# Patient Record
Sex: Female | Born: 1990 | Race: Black or African American | Hispanic: No | Marital: Single | Smoking: Never smoker
Health system: Southern US, Community
[De-identification: ages and names within clinical notes are randomized; demographics above are authoritative.]

## PROBLEM LIST (undated history)

## (undated) ENCOUNTER — Emergency Department: Discharge status: Absent without leave | Payer: Commercial Managed Care - PPO

## (undated) DIAGNOSIS — Z789 Other specified health status: Secondary | ICD-10-CM

## (undated) HISTORY — PX: WISDOM TOOTH EXTRACTION: SHX21

## (undated) HISTORY — PX: NO PAST SURGERIES: SHX2092

---
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
## 2016-09-26 ENCOUNTER — Other Ambulatory Visit
Admission: AD | Admit: 2016-09-26 | Discharge: 2016-09-26 | Disposition: A | Discharge status: Home / Self Care | Attending: Family Medicine | Admitting: Family Medicine

## 2016-09-26 NOTE — ED Notes (Signed)
Pt brought in to ED by [employer] officer Pride for forensic blood draw. Pt calm and cooperative in ED and consented to blood draw. Blood drawn by this RN with one attempt on left AC. Betadine was used in cleaning the site and clean gauze and tape put over area when draw was completed. Pt ambulated from ED with steady gait and NAD noted.

## 2020-01-07 ENCOUNTER — Other Ambulatory Visit: Payer: Self-pay

## 2020-01-07 ENCOUNTER — Other Ambulatory Visit (HOSPITAL_COMMUNITY)
Admission: RE | Admit: 2020-01-07 | Discharge: 2020-01-07 | Disposition: A | Discharge status: Home / Self Care | Source: Ambulatory Visit | Attending: Obstetrics and Gynecology | Admitting: Obstetrics and Gynecology

## 2020-01-07 ENCOUNTER — Encounter: Payer: Self-pay | Admitting: Obstetrics and Gynecology

## 2020-01-07 ENCOUNTER — Ambulatory Visit (INDEPENDENT_AMBULATORY_CARE_PROVIDER_SITE_OTHER): Payer: Commercial Managed Care - PPO | Admitting: Obstetrics and Gynecology

## 2020-01-07 VITALS — BP 118/78 | HR 92 | Ht 64.0 in | Wt 167.0 lb

## 2020-01-07 DIAGNOSIS — Z3149 Encounter for other procreative investigation and testing: Secondary | ICD-10-CM

## 2020-01-07 DIAGNOSIS — Z124 Encounter for screening for malignant neoplasm of cervix: Secondary | ICD-10-CM

## 2020-01-07 DIAGNOSIS — Z3401 Encounter for supervision of normal first pregnancy, first trimester: Secondary | ICD-10-CM

## 2020-01-07 DIAGNOSIS — Z113 Encounter for screening for infections with a predominantly sexual mode of transmission: Secondary | ICD-10-CM

## 2020-01-07 DIAGNOSIS — Z369 Encounter for antenatal screening, unspecified: Secondary | ICD-10-CM

## 2020-01-07 DIAGNOSIS — N926 Irregular menstruation, unspecified: Secondary | ICD-10-CM

## 2020-01-07 DIAGNOSIS — O219 Vomiting of pregnancy, unspecified: Secondary | ICD-10-CM

## 2020-01-07 DIAGNOSIS — Z7185 Encounter for immunization safety counseling: Secondary | ICD-10-CM

## 2020-01-07 DIAGNOSIS — Z130 Encounter for screening for diseases of the blood and blood-forming organs and certain disorders involving the immune mechanism: Secondary | ICD-10-CM

## 2020-01-07 DIAGNOSIS — Z3403 Encounter for supervision of normal first pregnancy, third trimester: Secondary | ICD-10-CM | POA: Insufficient documentation

## 2020-01-07 MED ORDER — DOXYLAMINE-PYRIDOXINE 10-10 MG PO TBEC: 2.0000 | DELAYED_RELEASE_TABLET | Freq: Every day | ORAL | 5 refills | Status: DC

## 2020-01-07 NOTE — Patient Instructions (Signed)
First Trimester of Pregnancy The first trimester of pregnancy is from week 1 until the end of week 13 (months 1 through 3). A week after a sperm fertilizes an egg, the egg will implant on the wall of the uterus. This embryo will begin to develop into a baby. Genes from you and your partner will form the baby. The female genes will determine whether the baby will be a boy or a girl. At 6-8 weeks, the eyes and face will be formed, and the heartbeat can be seen on ultrasound. At the end of 12 weeks, all the baby's organs will be formed. Now that you are pregnant, you will want to do everything you can to have a healthy baby. Two of the most important things are to get good prenatal care and to follow your health care provider's instructions. Prenatal care is all the medical care you receive before the baby's birth. This care will help prevent, find, and treat any problems during the pregnancy and childbirth. Body changes during your first trimester Your body goes through many changes during pregnancy. The changes vary from woman to woman.  You may gain or lose a couple of pounds at first.  You may feel sick to your stomach (nauseous) and you may throw up (vomit). If the vomiting is uncontrollable, call your health care provider.  You may tire easily.  You may develop headaches that can be relieved by medicines. All medicines should be approved by your health care provider.  You may urinate more often. Painful urination may mean you have a bladder infection.  You may develop heartburn as a result of your pregnancy.  You may develop constipation because certain hormones are causing the muscles that push stool through your intestines to slow down.  You may develop hemorrhoids or swollen veins (varicose veins).  Your breasts may begin to grow larger and become tender. Your nipples may stick out more, and the tissue that surrounds them (areola) may become darker.  Your gums may bleed and may be  sensitive to brushing and flossing.  Dark spots or blotches (chloasma, mask of pregnancy) may develop on your face. This will likely fade after the baby is born.  Your menstrual periods will stop.  You may have a loss of appetite.  You may develop cravings for certain kinds of food.  You may have changes in your emotions from day to day, such as being excited to be pregnant or being concerned that something may go wrong with the pregnancy and baby.  You may have more vivid and strange dreams.  You may have changes in your hair. These can include thickening of your hair, rapid growth, and changes in texture. Some women also have hair loss during or after pregnancy, or hair that feels dry or thin. Your hair will most likely return to normal after your baby is born. What to expect at prenatal visits During a routine prenatal visit:  You will be weighed to make sure you and the baby are growing normally.  Your blood pressure will be taken.  Your abdomen will be measured to track your baby's growth.  The fetal heartbeat will be listened to between weeks 10 and 14 of your pregnancy.  Test results from any previous visits will be discussed. Your health care provider may ask you:  How you are feeling.  If you are feeling the baby move.  If you have had any abnormal symptoms, such as leaking fluid, bleeding, severe headaches, or abdominal   cramping.  If you are using any tobacco products, including cigarettes, chewing tobacco, and electronic cigarettes.  If you have any questions. Other tests that may be performed during your first trimester include:  Blood tests to find your blood type and to check for the presence of any previous infections. The tests will also be used to check for low iron levels (anemia) and protein on red blood cells (Rh antibodies). Depending on your risk factors, or if you previously had diabetes during pregnancy, you may have tests to check for high blood sugar  that affects pregnant women (gestational diabetes).  Urine tests to check for infections, diabetes, or protein in the urine.  An ultrasound to confirm the proper growth and development of the baby.  Fetal screens for spinal cord problems (spina bifida) and Down syndrome.  HIV (human immunodeficiency virus) testing. Routine prenatal testing includes screening for HIV, unless you choose not to have this test.  You may need other tests to make sure you and the baby are doing well. Follow these instructions at home: Medicines  Follow your health care provider's instructions regarding medicine use. Specific medicines may be either safe or unsafe to take during pregnancy.  Take a prenatal vitamin that contains at least 600 micrograms (mcg) of folic acid.  If you develop constipation, try taking a stool softener if your health care provider approves. Eating and drinking   Eat a balanced diet that includes fresh fruits and vegetables, whole grains, good sources of protein such as meat, eggs, or tofu, and low-fat dairy. Your health care provider will help you determine the amount of weight gain that is right for you.  Avoid raw meat and uncooked cheese. These carry germs that can cause birth defects in the baby.  Eating four or five small meals rather than three large meals a day may help relieve nausea and vomiting. If you start to feel nauseous, eating a few soda crackers can be helpful. Drinking liquids between meals, instead of during meals, also seems to help ease nausea and vomiting.  Limit foods that are high in fat and processed sugars, such as fried and sweet foods.  To prevent constipation: ? Eat foods that are high in fiber, such as fresh fruits and vegetables, whole grains, and beans. ? Drink enough fluid to keep your urine clear or pale yellow. Activity  Exercise only as directed by your health care provider. Most women can continue their usual exercise routine during  pregnancy. Try to exercise for 30 minutes at least 5 days a week. Exercising will help you: ? Control your weight. ? Stay in shape. ? Be prepared for labor and delivery.  Experiencing pain or cramping in the lower abdomen or lower back is a good sign that you should stop exercising. Check with your health care provider before continuing with normal exercises.  Try to avoid standing for long periods of time. Move your legs often if you must stand in one place for a long time.  Avoid heavy lifting.  Wear low-heeled shoes and practice good posture.  You may continue to have sex unless your health care provider tells you not to. Relieving pain and discomfort  Wear a good support bra to relieve breast tenderness.  Take warm sitz baths to soothe any pain or discomfort caused by hemorrhoids. Use hemorrhoid cream if your health care provider approves.  Rest with your legs elevated if you have leg cramps or low back pain.  If you develop varicose veins in   your legs, wear support hose. Elevate your feet for 15 minutes, 3-4 times a day. Limit salt in your diet. Prenatal care  Schedule your prenatal visits by the twelfth week of pregnancy. They are usually scheduled monthly at first, then more often in the last 2 months before delivery.  Write down your questions. Take them to your prenatal visits.  Keep all your prenatal visits as told by your health care provider. This is important. Safety  Wear your seat belt at all times when driving.  Make a list of emergency phone numbers, including numbers for family, friends, the hospital, and police and fire departments. General instructions  Ask your health care provider for a referral to a local prenatal education class. Begin classes no later than the beginning of month 6 of your pregnancy.  Ask for help if you have counseling or nutritional needs during pregnancy. Your health care provider can offer advice or refer you to specialists for help  with various needs.  Do not use hot tubs, steam rooms, or saunas.  Do not douche or use tampons or scented sanitary pads.  Do not cross your legs for long periods of time.  Avoid cat litter boxes and soil used by cats. These carry germs that can cause birth defects in the baby and possibly loss of the fetus by miscarriage or stillbirth.  Avoid all smoking, herbs, alcohol, and medicines not prescribed by your health care provider. Chemicals in these products affect the formation and growth of the baby.  Do not use any products that contain nicotine or tobacco, such as cigarettes and e-cigarettes. If you need help quitting, ask your health care provider. You may receive counseling support and other resources to help you quit.  Schedule a dentist appointment. At home, brush your teeth with a soft toothbrush and be gentle when you floss. Contact a health care provider if:  You have dizziness.  You have mild pelvic cramps, pelvic pressure, or nagging pain in the abdominal area.  You have persistent nausea, vomiting, or diarrhea.  You have a bad smelling vaginal discharge.  You have pain when you urinate.  You notice increased swelling in your face, hands, legs, or ankles.  You are exposed to fifth disease or chickenpox.  You are exposed to German measles (rubella) and have never had it. Get help right away if:  You have a fever.  You are leaking fluid from your vagina.  You have spotting or bleeding from your vagina.  You have severe abdominal cramping or pain.  You have rapid weight gain or loss.  You vomit blood or material that looks like coffee grounds.  You develop a severe headache.  You have shortness of breath.  You have any kind of trauma, such as from a fall or a car accident. Summary  The first trimester of pregnancy is from week 1 until the end of week 13 (months 1 through 3).  Your body goes through many changes during pregnancy. The changes vary from  woman to woman.  You will have routine prenatal visits. During those visits, your health care provider will examine you, discuss any test results you may have, and talk with you about how you are feeling. This information is not intended to replace advice given to you by your health care provider. Make sure you discuss any questions you have with your health care provider. Document Revised: 12/10/2016 Document Reviewed: 12/10/2015 Elsevier Patient Education  2020 Elsevier Inc[employer]  

## 2020-01-07 NOTE — Progress Notes (Signed)
New Obstetric Patient H&P    Chief Complaint: Missed period, + home pregnancy test   History of Present Illness: Patient is a 29 y.o. G1P0 Not Hispanic or Latino female, presents with amenorrhea and positive home pregnancy test. Patient's last menstrual period was 10/10/2019 (exact date). and based on her  LMP, her EDD is Estimated Date of Delivery: 07/16/20 and her EGA is [redacted]w[redacted]d. Cycles are 7. days, regular, and occur approximately every : 28 days. Patient unsure of last pap - denies hx of abn pap smears.  She had a urine pregnancy test which was positive 3 or 4 week(s)  ago. Her last menstrual period was normal and lasted for  6 or 7 day(s). Since her LMP she claims she has experienced food aversion, decreased appetite. She denies vaginal bleeding. Her past medical history is noncontributory. This is her first pregnancy.  Since her LMP, she admits to the use of tobacco products  no She claims has  lost 7  pounds since the start of her pregnancy. Patient states this is more related to food aversion and nausea than frequent episodes of vomiting. There are cats in the home in the home  no She admits close contact with children on a regular basis  no  She has had chicken pox in the past no She has had Tuberculosis exposures, symptoms, or previously tested positive for TB   no Current or past history of domestic violence. no  Genetic Screening/Teratology Counseling: (Includes patient, baby's father, or anyone in either family with:)   1. Patient's age >/= 58 at [facility]  no 2. Thalassemia ([place], [place], Mediterranean, or Asian background): MCV<80  no 3. Neural tube defect (meningomyelocele, spina bifida, anencephaly)  no 4. Congenital heart defect  no  5. Down syndrome  no 6. Tay-Sachs (Jewish, [place]  no 7. Canavan's Disease  no 8. Sickle cell disease or trait (African)  no  9. Hemophilia or other blood disorders  no  10. Muscular dystrophy  no  11. Cystic fibrosis  no  12.  Huntington's Chorea  no  13. Mental retardation/autism  no 14. Other inherited genetic or chromosomal disorder  no 15. Maternal metabolic disorder (DM, PKU, etc)  no 16. Patient or FOB with a child with a birth defect not listed above no  16a. Patient or FOB with a birth defect themselves no 17. Recurrent pregnancy loss, or stillbirth  no  18. Any medications since LMP other than prenatal vitamins (include vitamins, supplements, OTC meds, drugs, alcohol)  no 19. Any other genetic/environmental exposure to discuss  no  Infection History:   1. Lives with someone with TB or TB exposed  no  2. Patient or partner has history of genital herpes  no 3. Rash or viral illness since LMP  no 4. History of STI (GC, CT, HPV, syphilis, HIV)  no 5. 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     Review of Systems:10 point review of systems negative unless otherwise noted in HPI  Past Medical History:  Patient Active Problem List   Diagnosis Date Noted  . Encounter for supervision of normal first pregnancy in first trimester 01/07/2020    Clinic [facility] Prenatal Labs  Dating  LMP Blood type:     Genetic Screen Inheritest:  NIPS: Antibody:   Anatomic [place]  Rubella:   Varicella: @VZVIGG @  GTT Early: n/a        Third  trimester:  RPR:     Rhogam  HBsAg:     TDaP vaccine                       Flu Shot: declines HIV:     Baby Food    Undecided - breastfeeding ed at [facility]                            GBS:   Contraception  Pap: collected 01/07/20  CBB     CS/VBAC    Support Person           Past Surgical History:  History reviewed. No pertinent surgical history.  Gynecologic History: Patient's last menstrual period was 10/10/2019 (exact date).  Obstetric History: G1P0  Family History:  Family History  Problem Relation Age of Onset  . Cancer Maternal Aunt     Social History:  Social History   Socioeconomic  History  . Marital status: Single    Spouse name: Not on file  . Number of children: Not on file  . Years of education: Not on file  . Highest education level: Not on file  Occupational History  . Not on file  Tobacco Use  . Smoking status: Never Smoker  . Smokeless tobacco: Never Used  Vaping Use  . Vaping Use: Never used  Substance and Sexual Activity  . Alcohol use: Never  . Drug use: Never  . Sexual activity: Yes    Birth control/protection: None  Other Topics Concern  . Not on file  Social History Narrative  . Not on file   Social Determinants of Health   Financial Resource Strain: Not on file  Food Insecurity: Not on file  Transportation Needs: Not on file  Physical Activity: Not on file  Stress: Not on file  Social Connections: Not on file  Intimate Partner Violence: Not on file    Allergies:  No Known Allergies  Medications: Prior to Admission medications   Medication Sig Start Date End Date Taking? Authorizing Provider  Prenatal Vit-Fe Fumarate-FA (MULTIVITAMIN-PRENATAL) 27-0.8 MG TABS tablet Take 1 tablet by mouth daily at 12 noon.   Yes [provider]    Physical Exam Vitals: Blood pressure 118/78, pulse 92, height 5\' 4"  (1.626 m), weight 167 lb (75.8 kg), last menstrual period 10/10/2019.  General: NAD HEENT: normocephalic, anicteric Thyroid: no enlargement, no palpable nodules Pulmonary: No increased work of breathing, CTAB Cardiovascular: RRR, distal pulses 2+ Abdomen: NABS, soft, non-tender, non-distended.  Umbilicus without lesions.  No hepatomegaly, splenomegaly or masses palpable. No evidence of hernia  Genitourinary:  External: Normal external female genitalia.  Normal urethral meatus, normal  Bartholin's and Skene's glands.    Vagina: Normal vaginal mucosa, no evidence of prolapse.    Cervix: Grossly normal in appearance, no bleeding  Uterus: Enlarged (size consistent with 10-12wk dates), mobile, normal contour.  No CMT  Adnexa:  ovaries non-enlarged, no adnexal masses  Rectal: deferred Extremities: no edema, erythema, or tenderness Neurologic: Grossly intact Psychiatric: mood appropriate, affect full   Assessment: 29 y.o. G1P0 at [redacted]w[redacted]d presenting to initiate prenatal care  Plan: 1) Avoid alcoholic beverages. 2) Patient encouraged not to smoke.  3) Discontinue the use of all non-medicinal drugs and chemicals.  4) Take prenatal vitamins daily.  5) Nutrition, food safety (fish, cheese advisories, and high nitrite foods) and exercise discussed. 6) Hospital and practice style discussed with cross coverage system.  7) Genetic Screening,  such as with 1st Trimester Screening, cell free fetal DNA, AFP testing, and Ultrasound, as well as with amniocentesis and CVS as appropriate, is discussed with patient. At the conclusion of today's visit patient requested genetic testing - Inheritest today - NIPTs after dating [place] 8)NOB labs, pap/gc/ct, urine cx collected today 9) RTC in 2 wks for dating [place] and ROB   Zipporah Plants, CNM, MSN [facility], [facility] 01/07/2020, 4:33 PM

## 2020-01-09 LAB — URINE DRUG PANEL 7
Amphetamines, Urine: NEGATIVE ng/mL
Barbiturate Quant, Ur: NEGATIVE ng/mL
Benzodiazepine Quant, Ur: NEGATIVE ng/mL
Cannabinoid Quant, Ur: NEGATIVE ng/mL
Cocaine (Metab.): NEGATIVE ng/mL
Opiate Quant, Ur: NEGATIVE ng/mL
PCP Quant, Ur: NEGATIVE ng/mL

## 2020-01-09 LAB — CYTOLOGY - PAP
Chlamydia: NEGATIVE
Comment: NEGATIVE
Comment: NORMAL
Diagnosis: NEGATIVE
Neisseria Gonorrhea: NEGATIVE

## 2020-01-11 LAB — URINE CULTURE

## 2020-01-12 NOTE — L&D Delivery Note (Signed)
Delivery Note At 6:38 PM a viable female was delivered via Vaginal, Spontaneous (Presentation: Left Occiput Anterior).  APGAR: 9, 9; weight pending .   Placenta status: Spontaneous, Intact.  Cord: 3 vessels with the following complications: None.  Cord pH: n/a  Anesthesia: Epidural Episiotomy: None Lacerations: 1st degree;Vaginal Suture Repair:  4.0 vicryl Est. Blood Loss (mL):  pending (~375 mL)  Mom to postpartum.  Baby to Couplet care / Skin to Skin.  Called to see patient.  Mom pushed to deliver a viable female infant.  The head followed by shoulders, which delivered without difficulty, and the rest of the body.  No nuchal cord noted.  Baby to mom's chest.  Cord clamped and cut after > 1 min delay.  No cord blood obtained.  Placenta delivered spontaneously, intact, with a 3-vessel cord.  A small first degree left vaginal laceration repaired with 4-0 Vicryl in standard fashion.  All counts correct.  Hemostasis obtained with IV pitocin and fundal massage. EBL 375 mL.     Thomasene Mohair, MD 07/04/2020, 7:04 PM

## 2020-01-15 ENCOUNTER — Other Ambulatory Visit: Payer: Self-pay | Admitting: Obstetrics and Gynecology

## 2020-01-15 DIAGNOSIS — O360930 Maternal care for other rhesus isoimmunization, third trimester, not applicable or unspecified: Secondary | ICD-10-CM | POA: Insufficient documentation

## 2020-01-15 DIAGNOSIS — O360911 Maternal care for other rhesus isoimmunization, first trimester, fetus 1: Secondary | ICD-10-CM

## 2020-01-15 LAB — RPR+RH+ABO+RUB AB+AB SCR+CB...
HIV Screen 4th Generation wRfx: NONREACTIVE
Hematocrit: 38.8 % (ref 34.0–46.6)
Hemoglobin: 13.3 g/dL (ref 11.1–15.9)
Hepatitis B Surface Ag: NEGATIVE
MCH: 30.3 pg (ref 26.6–33.0)
MCHC: 34.3 g/dL (ref 31.5–35.7)
MCV: 88 fL (ref 79–97)
Platelets: 227 10*3/uL (ref 150–450)
RBC: 4.39 x10E6/uL (ref 3.77–5.28)
RDW: 12.2 % (ref 11.7–15.4)
RPR Ser Ql: NONREACTIVE
Rh Factor: POSITIVE
Rubella Antibodies, IGG: 2.26 index (ref >0.99)
Varicella zoster IgG: 260 index (ref >165)
WBC: 6.9 10*3/uL (ref 3.4–10.8)

## 2020-01-15 LAB — SICKLE CELL SCREEN: Sickle Cell Screen: NEGATIVE

## 2020-01-15 LAB — AB SCR+ANTIBODY ID
Antibody Screen: POSITIVE — AB
Coombs Titer #1: 2

## 2020-01-18 LAB — INHERITEST CORE(CF97,SMA,FRAX)

## 2020-01-22 ENCOUNTER — Ambulatory Visit (INDEPENDENT_AMBULATORY_CARE_PROVIDER_SITE_OTHER): Payer: Commercial Managed Care - PPO

## 2020-01-22 ENCOUNTER — Ambulatory Visit (INDEPENDENT_AMBULATORY_CARE_PROVIDER_SITE_OTHER): Payer: Commercial Managed Care - PPO | Admitting: Obstetrics and Gynecology

## 2020-01-22 ENCOUNTER — Encounter: Payer: Self-pay | Admitting: Obstetrics and Gynecology

## 2020-01-22 ENCOUNTER — Other Ambulatory Visit: Payer: Self-pay

## 2020-01-22 VITALS — BP 108/64 | Wt 167.0 lb

## 2020-01-22 DIAGNOSIS — O360911 Maternal care for other rhesus isoimmunization, first trimester, fetus 1: Secondary | ICD-10-CM

## 2020-01-22 DIAGNOSIS — Z3A15 15 weeks gestation of pregnancy: Secondary | ICD-10-CM

## 2020-01-22 DIAGNOSIS — N926 Irregular menstruation, unspecified: Secondary | ICD-10-CM | POA: Diagnosis not present

## 2020-01-22 DIAGNOSIS — Z23 Encounter for immunization: Secondary | ICD-10-CM

## 2020-01-22 DIAGNOSIS — Z1379 Encounter for other screening for genetic and chromosomal anomalies: Secondary | ICD-10-CM

## 2020-01-22 DIAGNOSIS — Z3402 Encounter for supervision of normal first pregnancy, second trimester: Secondary | ICD-10-CM

## 2020-01-22 LAB — POCT URINALYSIS DIPSTICK OB
Glucose, UA: NEGATIVE
POC,PROTEIN,UA: NEGATIVE

## 2020-01-22 MED ORDER — PROMETHAZINE HCL 25 MG PO TABS: 25.0000 mg | ORAL_TABLET | Freq: Four times a day (QID) | ORAL | 3 refills | Status: DC | PRN

## 2020-01-22 NOTE — Progress Notes (Signed)
ROB [redacted]w[redacted]d

## 2020-01-22 NOTE — Patient Instructions (Addendum)
Initial steps to help :   B6 (pyridoxine) 25 mg,  3-4 times a day- 200 mg a day total Unisom (doxylamine) 25 mg at bedtime **B6 and Unisom are available as a combination prescription medications called diclegis and bonjesta  B1 (thiamin)  50-100 mg 1-2 a day-  100 mg a day total  Continue prenatal vitamin with iron and thiamin. If it is not tolerated switch to 1 mg of folic acid.  Can add medication for gastric reflux if needed.  Subsequent steps to be added to B1, B6, and Unisom:  1. Antihistamine (one of the following medications) Dramamine      25-50 mg every 4-6 hours Benadryl      25-50 mg every 4-6 hours Meclizine      25 mg every 6 hours  2. Dopamine Antagonist (one of the following medications) Metoclopramide  (Reglan)  5-10 mg every 6-8 hours         PO Promethazine   (Phenergan)   12.5-25 mg every 4-6 hours      PO or rectal Prochlorperazine  (Compazine)  5-10 mg every 6-8 hours     25mg  BID rectally   Subsequent steps if there has still not been improvement in symptoms:  3. Daily stool softner:  Colace 100 mg twice a day  4. Ondansetron  (Zofran)   4-8 mg every 6-8 hours     Hyperemesis Gravidarum Hyperemesis gravidarum is a severe form of nausea and vomiting that happens during pregnancy. Hyperemesis is worse than morning sickness. It may cause you to have nausea or vomiting all day for many days. It may keep you from eating and drinking enough food and liquids, which can lead to dehydration, malnutrition, and weight loss. Hyperemesis usually occurs during the first half (the first 20 weeks) of pregnancy. It often goes away once a woman is in her second half of pregnancy. However, sometimes hyperemesis continues through an entire pregnancy. What are the causes? The cause of this condition is not known. It may be associated with:  Changes in hormones in the body during pregnancy.  Changes in the gastrointestinal system.  Genetic or inherited conditions. What are the  signs or symptoms? Symptoms of this condition include:  Severe nausea and vomiting that does not go away.  Problems keeping food down.  Weight loss.  Loss of body fluid (dehydration).  Loss of appetite. You may have no desire to eat or you may not like the food you have previously enjoyed. How is this diagnosed? This condition may be diagnosed based on your medical history, your symptoms, and a physical exam. You may also have other tests, including:  Blood tests.  Urine tests.  Blood pressure tests.  Ultrasound to look for problems with the placenta or to check if you are pregnant with more than one baby. How is this treated? This condition is managed by controlling symptoms. This may include:  Following an eating plan. This can help to lessen nausea and vomiting.  Treatments that do not use medicine. These include acupressure bracelets, hypnosis, and eating or drinking foods or fluids that contain ginger, ginger ale, or ginger tea.  Taking prescription medicine or over-the-counter medicine as told by your health care provider.  Continuing to take prenatal vitamins. You may need to change what kind you take and when you take them. Follow your health care provider's instructions about prenatal vitamins. An eating plan and medicines are often used together to help control symptoms. If medicines do not help  relieve nausea and vomiting, you may need to receive fluids through an IV at the hospital. Follow these instructions at home: To help relieve your symptoms, listen to your body. Everyone is different and has different preferences. Find what works best for you. Here are some things you can try to help relieve your symptoms: Meals and snacks  Eat 5-6 small meals daily instead of 3 large meals. Eating small meals and snacks can help you avoid an empty stomach.  Before getting out of bed, eat a couple of crackers to avoid moving around on an empty stomach.  Eat a protein-rich  snack before bed. Examples include cheese and crackers, or a peanut butter sandwich made with 1 slice of whole-wheat bread and 1 tsp (5 g) of peanut butter.  Eat and drink slowly.  Try eating starchy foods as these are usually tolerated well. Examples include cereal, toast, bread, potatoes, pasta, rice, and pretzels.  Eat at least one serving of protein with your meals and snacks. Protein options include lean meats, poultry, seafood, beans, nuts, nut butters, eggs, cheese, and yogurt.  Eat or suck on things that have ginger in them. It may help to relieve nausea. Add  tsp (0.44 g) ground ginger to hot tea, or choose ginger tea.   Fluids It is important to stay hydrated. Try to:  Drink small amounts of fluids often.  Drink fluids 30 minutes before or after a meal to help lessen the feeling of a full stomach.  Drink 100% fruit juice or an electrolyte drink. An electrolyte drink contains sodium, potassium, and chloride.  Drink fluids that are cold, clear, and carbonated or sour. These include lemonade, ginger ale, lemon-lime soda, ice water, and sparkling water. Things to avoid Avoid the following:  Eating foods that trigger your symptoms. These may include spicy foods, coffee, high-fat foods, very sweet foods, and acidic foods.  Drinking more than 1 cup of fluid at a time.  Skipping meals. Nausea can be more intense on an empty stomach. If you cannot tolerate food, do not force it. Try sucking on ice chips or other frozen items and make up for missed calories later.  Lying down within 2 hours after eating.  Being exposed to environmental triggers. These may include food smells, smoky rooms, closed spaces, rooms with strong smells, warm or humid places, overly loud and noisy rooms, and rooms with motion or flickering lights. Try eating meals in a well-ventilated area that is free of strong smells.  Making quick and sudden changes in your movement.  Taking iron pills and multivitamins  that contain iron. If you take prescription iron pills, do not stop taking them unless your health care provider approves.  Preparing food. The smell of food can spoil your appetite or trigger nausea. General instructions  Brush your teeth or use a mouth rinse after meals.  Take over-the-counter and prescription medicines only as told by your health care provider.  Follow instructions from your health care provider about eating or drinking restrictions.  Talk with your health care provider about starting a supplement of vitamin B6.  Continue to take your prenatal vitamins as told by your health care provider. If you are having trouble taking your prenatal vitamins, talk with your health care provider about other options.  Keep all follow-up visits. This is important. Follow-up visits include prenatal visits. Contact a health care provider if:  You have pain in your abdomen.  You have a severe headache.  You have vision problems.  You are losing weight.  You feel weak or dizzy.  You cannot eat or drink without vomiting, especially if this goes on for a full day. Get help right away if:  You cannot drink fluids without vomiting.  You vomit blood.  You have constant nausea and vomiting.  You are very weak.  You faint.  You have a fever and your symptoms suddenly get worse. Summary  Hyperemesis gravidarum is a severe form of nausea and vomiting that happens during pregnancy.  Making some changes to your eating habits may help relieve nausea and vomiting.  This condition may be managed with lifestyle changes and medicines as prescribed by your health care provider.  If medicines do not help relieve nausea and vomiting, you may need to receive fluids through an IV at the hospital. This information is not intended to replace advice given to you by your health care provider. Make sure you discuss any questions you have with your health care provider. Document Revised:  07/23/2019 Document Reviewed: 07/23/2019 Elsevier Patient Education  2021 Elsevier Inc.    Second Trimester of Pregnancy  The second trimester of pregnancy is from week 13 through week 27. This is months 4 through 6 of pregnancy. The second trimester is often a time when you feel your best. Your body has adjusted to being pregnant, and you begin to feel better physically. During the second trimester:  Morning sickness has lessened or stopped completely.  You may have more energy.  You may have an increase in appetite. The second trimester is also a time when the unborn baby (fetus) is growing rapidly. At the end of the sixth month, the fetus may be up to 12 inches long and weigh about 1 pounds. You will likely begin to feel the baby move (quickening) between 16 and 20 weeks of pregnancy. Body changes during your second trimester Your body continues to go through many changes during your second trimester. The changes vary and generally return to normal after the baby is born. Physical changes  Your weight will continue to increase. You will notice your lower abdomen bulging out.  You may begin to get stretch marks on your hips, abdomen, and breasts.  Your breasts will continue to grow and to become tender.  Dark spots or blotches (chloasma or mask of pregnancy) may develop on your face.  A dark line from your belly button to the pubic area (linea nigra) may appear.  You may have changes in your hair. These can include thickening of your hair, rapid growth, and changes in texture. Some people also have hair loss during or after pregnancy, or hair that feels dry or thin. Health changes  You may develop headaches.  You may have heartburn.  You may develop constipation.  You may develop hemorrhoids or swollen, bulging veins (varicose veins).  Your gums may bleed and may be sensitive to brushing and flossing.  You may urinate more often because the fetus is pressing on your  bladder.  You may have back pain. This is caused by: ? Weight gain. ? Pregnancy hormones that are relaxing the joints in your pelvis. ? A shift in weight and the muscles that support your balance. Follow these instructions at home: Medicines  Follow your health care provider's instructions regarding medicine use. Specific medicines may be either safe or unsafe to take during pregnancy. Do not take any medicines unless approved by your health care provider.  Take a prenatal vitamin that contains at least 600 micrograms (  mcg) of folic acid. Eating and drinking  Eat a healthy diet that includes fresh fruits and vegetables, whole grains, good sources of protein such as meat, eggs, or tofu, and low-fat dairy products.  Avoid raw meat and unpasteurized juice, milk, and cheese. These carry germs that can harm you and your baby.  You may need to take these actions to prevent or treat constipation: ? Drink enough fluid to keep your urine pale yellow. ? Eat foods that are high in fiber, such as beans, whole grains, and fresh fruits and vegetables. ? Limit foods that are high in fat and processed sugars, such as fried or sweet foods. Activity  Exercise only as directed by your health care provider. Most people can continue their usual exercise routine during pregnancy. Try to exercise for 30 minutes at least 5 days a week. Stop exercising if you develop contractions in your uterus.  Stop exercising if you develop pain or cramping in the lower abdomen or lower back.  Avoid exercising if it is very hot or humid or if you are at a high altitude.  Avoid heavy lifting.  If you choose to, you may have sex unless your health care provider tells you not to. Relieving pain and discomfort  Wear a supportive bra to prevent discomfort from breast tenderness.  Take warm sitz baths to soothe any pain or discomfort caused by hemorrhoids. Use hemorrhoid cream if your health care provider approves.  Rest  with your legs raised (elevated) if you have leg cramps or low back pain.  If you develop varicose veins: ? Wear support hose as told by your health care provider. ? Elevate your feet for 15 minutes, 3-4 times a day. ? Limit salt in your diet. Safety  Wear your seat belt at all times when driving or riding in a car.  Talk with your health care provider if someone is verbally or physically abusive to you. Lifestyle  Do not use hot tubs, steam rooms, or saunas.  Do not douche. Do not use tampons or scented sanitary pads.  Avoid cat litter boxes and soil used by cats. These carry germs that can cause birth defects in the baby and possibly loss of the fetus by miscarriage or stillbirth.  Do not use herbal remedies, alcohol, illegal drugs, or medicines that are not approved by your health care provider. Chemicals in these products can harm your baby.  Do not use any products that contain nicotine or tobacco, such as cigarettes, e-cigarettes, and chewing tobacco. If you need help quitting, ask your health care provider. General instructions  During a routine prenatal visit, your health care provider will do a physical exam and other tests. He or she will also discuss your overall health. Keep all follow-up visits. This is important.  Ask your health care provider for a referral to a local prenatal education class.  Ask for help if you have counseling or nutritional needs during pregnancy. Your health care provider can offer advice or refer you to specialists for help with various needs. Where to find more information  American Pregnancy Association: americanpregnancy.org  American College of Obstetricians and Gynecologists: acog.org/en/Womens%20Health/Pregnancy  Office on Women's Health: womenshealth.gov/pregnancy Contact a health care provider if you have:  A headache that does not go away when you take medicine.  Vision changes or you see spots in front of your eyes.  Mild  pelvic cramps, pelvic pressure, or nagging pain in the abdominal area.  Persistent nausea, vomiting, or diarrhea.  A   bad-smelling vaginal discharge or foul-smelling urine.  Pain when you urinate.  Sudden or extreme swelling of your face, hands, ankles, feet, or legs.  A fever. Get help right away if you:  Have fluid leaking from your vagina.  Have spotting or bleeding from your vagina.  Have severe abdominal cramping or pain.  Have difficulty breathing.  Have chest pain.  Have fainting spells.  Have not felt your baby move for the time period told by your health care provider.  Have new or increased pain, swelling, or redness in an arm or leg. Summary  The second trimester of pregnancy is from week 13 through week 27 (months 4 through 6).  Do not use herbal remedies, alcohol, illegal drugs, or medicines that are not approved by your health care provider. Chemicals in these products can harm your baby.  Exercise only as directed by your health care provider. Most people can continue their usual exercise routine during pregnancy.  Keep all follow-up visits. This is important. This information is not intended to replace advice given to you by your health care provider. Make sure you discuss any questions you have with your health care provider. Document Revised: 06/06/2019 Document Reviewed: 04/12/2019 Elsevier Patient Education  2021 Elsevier Inc.   

## 2020-01-22 NOTE — Progress Notes (Signed)
    Routine Prenatal Care Visit  Subjective  Rebecca Mccann is a 30 y.o. G1P0 at [redacted]w[redacted]d being seen today for ongoing prenatal care.  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  ----------------------------------------------------------------------------------- Patient reports no complaints.   Contractions: Not present. Vag. Bleeding: None.  Movement: Absent. Denies leaking of fluid.  ----------------------------------------------------------------------------------- The following portions of the patient's history were reviewed and updated as appropriate: allergies, current medications, past family history, past medical history, past social history, past surgical history and problem list. Problem list updated.   Objective  Last menstrual period 10/10/2019. Pregravid weight 174 lb (78.9 kg) Total Weight Gain -7 lb (-3.175 kg) Urinalysis:      Fetal Status: Fetal Heart Rate (bpm): 148   Movement: Absent     General:  Alert, oriented and cooperative. Patient is in no acute distress.  Skin: Skin is warm and dry. No rash noted.   Cardiovascular: Normal heart rate noted  Respiratory: Normal respiratory effort, no problems with respiration noted  Abdomen: Soft, gravid, appropriate for gestational age. Pain/Pressure: Absent     Pelvic:  Cervical exam deferred        Extremities: Normal range of motion.  Edema: None  Mental Status: Normal mood and affect. Normal behavior. Normal judgment and thought content.     Assessment   30 y.o. G1P0 at [redacted]w[redacted]d by  07/16/2020, by Last Menstrual Period presenting for routine prenatal visit  Plan   pregnancy 1 Problems (from 01/07/20 to present)    Problem Noted Resolved   Encounter for supervision of normal first pregnancy in  first trimester 01/07/2020 by Zipporah Plants, CNM No   Overview Addendum 01/22/2020  5:36 PM by Natale Milch, MD     Nursing Staff Provider  Office Location  [facility] Dating   lmp = 14 wk [place]  Language  English Anatomy US    Flu Vaccine  01/22/2019 Genetic Screen  NIPS:    TDaP vaccine    Hgb A1C or  GTT Third trimester :   Rhogam   not needed   LAB RESULTS   Feeding Plan  Blood Type A/Positive/-- (12/27 1526)   Contraception  Antibody Positive,    Circumcision  Rubella 2.26 (12/27 1526)  Pediatrician   RPR Non Reactive (12/27 1526)   Support Person  HBsAg Negative (12/27 1526)   Prenatal Classes  HIV Non Reactive (12/27 1526)    Varicella  Immune  BTL Consent  GBS  (For PCN allergy, check sensitivities)        VBAC Consent  Pap  NIL 2021    Hgb Electro   negative    CF  negative     SMA  negative    Fragile x  negative         Previous Version       Encouraged covid vaccination Flu shot today  Has MFM follow up for Anti-E scheduled Will return for Maternit21 testing, lab is closed today  Gestational age appropriate obstetric precautions including but not limited to vaginal bleeding, contractions, leaking of fluid and fetal movement were reviewed in detail with the patient.    Return in about 2 weeks (around 02/05/2020) for ROB visit in person.  Natale Milch MD [facility], [facility] 01/22/2020, 6:10 PM

## 2020-01-29 ENCOUNTER — Other Ambulatory Visit: Payer: Commercial Managed Care - PPO

## 2020-01-29 ENCOUNTER — Ambulatory Visit: Payer: Commercial Managed Care - PPO

## 2020-01-29 NOTE — Progress Notes (Deleted)
Pt was a "No Show" for Maternal Fetal Care appt at [facility] today.

## 2020-01-31 ENCOUNTER — Other Ambulatory Visit: Payer: Commercial Managed Care - PPO

## 2020-02-05 ENCOUNTER — Other Ambulatory Visit: Payer: Self-pay

## 2020-02-05 ENCOUNTER — Ambulatory Visit (INDEPENDENT_AMBULATORY_CARE_PROVIDER_SITE_OTHER): Payer: Commercial Managed Care - PPO | Admitting: Obstetrics and Gynecology

## 2020-02-05 ENCOUNTER — Encounter: Payer: Self-pay | Admitting: Obstetrics and Gynecology

## 2020-02-05 VITALS — BP 100/66 | Wt 163.0 lb

## 2020-02-05 DIAGNOSIS — O360911 Maternal care for other rhesus isoimmunization, first trimester, fetus 1: Secondary | ICD-10-CM

## 2020-02-05 DIAGNOSIS — Z1379 Encounter for other screening for genetic and chromosomal anomalies: Secondary | ICD-10-CM

## 2020-02-05 DIAGNOSIS — Z3A16 16 weeks gestation of pregnancy: Secondary | ICD-10-CM

## 2020-02-05 DIAGNOSIS — Z3402 Encounter for supervision of normal first pregnancy, second trimester: Secondary | ICD-10-CM

## 2020-02-05 NOTE — Progress Notes (Signed)
Routine Prenatal Care Visit  Subjective  Rebecca Mccann is a 30 y.o. G1P0 at [redacted]w[redacted]d being seen today for ongoing prenatal care.  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  ----------------------------------------------------------------------------------- Patient reports no complaints.  Patient reports an improvement in sx of N/V. Tolerating oral intake. Contractions: Not present. Vag. Bleeding: None.  Movement: Absent. Denies leaking of fluid.  ----------------------------------------------------------------------------------- The following portions of the patient's history were reviewed and updated as appropriate: allergies, current medications, past family history, past medical history, past social history, past surgical history and problem list. Problem list updated.   Objective  Blood pressure 100/66, weight 163 lb (73.9 kg), last menstrual period 10/10/2019. Pregravid weight 174 lb (78.9 kg) Total Weight Gain -11 lb (-4.99 kg) Urinalysis:      Fetal Status: Fetal Heart Rate (bpm): 155   Movement: Absent     General:  Alert, oriented and cooperative. Patient is in no acute distress.  Skin: Skin is warm and dry. No rash noted.   Cardiovascular: Normal heart rate noted  Respiratory: Normal respiratory effort, no problems with respiration noted  Abdomen: Soft, gravid, appropriate for gestational age. Pain/Pressure: Absent     Pelvic:  Cervical exam deferred        Extremities: Normal range of motion.  Edema: None  ental Status: Normal mood and affect. Normal behavior. Normal judgment and thought content.     Assessment   30 y.o. G1P0 at [redacted]w[redacted]d by  07/16/2020, by Last Menstrual Period presenting for routine prenatal visit  Plan   pregnancy 1 Problems  (from 01/07/20 to present)    Problem Noted Resolved   Encounter for supervision of normal first pregnancy in first trimester 01/07/2020 by Zipporah Plants, CNM No   Overview Addendum 01/22/2020  5:36 PM by Natale Milch, MD     Nursing Staff Provider  Office Location  [facility] Dating   lmp = 14 wk [place]  Language  English Anatomy US    Flu Vaccine  01/22/2019 Genetic Screen  NIPS:    TDaP vaccine    Hgb A1C or  GTT Third trimester :   Rhogam   not needed   LAB RESULTS   Feeding Plan  Blood Type A/Positive/-- (12/27 1526)   Contraception  Antibody Positive,    Circumcision  Rubella 2.26 (12/27 1526)  Pediatrician   RPR Non Reactive (12/27 1526)   Support Person  HBsAg Negative (12/27 1526)   Prenatal Classes  HIV Non Reactive (12/27 1526)    Varicella  Immune  BTL Consent  GBS  (For PCN allergy, check sensitivities)        VBAC Consent  Pap  NIL 2021    Hgb Electro   negative    CF  negative     SMA  negative    Fragile x  negative         Previous Version      -Will collected antibody screen recheck today -MFM visit rescheduled due to weather - patient planning to f/u -NIPS today  Second trimester precautions including but not limited to vaginal bleeding, contractions, leaking of fluid and fetal movement were reviewed in detail with the patient.    Return in about 3 weeks (around 02/26/2020) for ROB and anatomy US.  Zipporah Plants, CNM, MSN [facility],  [facility] 02/05/2020, 9:43 PM

## 2020-02-07 LAB — AB SCR+ANTIBODY ID
Antibody Screen: POSITIVE — AB
Coombs Titer #1: 2

## 2020-02-07 LAB — ANTIBODY SCREEN

## 2020-02-11 LAB — MATERNIT 21 PLUS CORE, BLOOD
Fetal Fraction: 13
Result (T21): NEGATIVE
Trisomy 13 (Patau syndrome): NEGATIVE
Trisomy 18 (Edwards syndrome): NEGATIVE
Trisomy 21 (Down syndrome): NEGATIVE

## 2020-02-25 NOTE — Telephone Encounter (Signed)
Patient states she received +Covid test results today. Inquiring safe meds to take. [phone]

## 2020-02-26 ENCOUNTER — Telehealth: Payer: Self-pay

## 2020-02-26 NOTE — Telephone Encounter (Signed)
Pt calling; needs to resch u/s; what meds are safe to take.  [phone] LMTC c sxs.

## 2020-02-26 NOTE — Telephone Encounter (Signed)
Pt returned call; u/s resch then tx to me; c/o runny nose, congestion, cough, sneezing, body aches. Adv plain robitussin/musinex, plain sudafed, Hall's cough drops, warm salt water gargles, e.s. tylenol, sip hot beverages/soup, push fluids and rest.

## 2020-02-29 ENCOUNTER — Other Ambulatory Visit: Payer: Self-pay

## 2020-02-29 ENCOUNTER — Ambulatory Visit (INDEPENDENT_AMBULATORY_CARE_PROVIDER_SITE_OTHER): Payer: Commercial Managed Care - PPO | Admitting: Obstetrics and Gynecology

## 2020-02-29 ENCOUNTER — Ambulatory Visit: Payer: Commercial Managed Care - PPO

## 2020-02-29 DIAGNOSIS — Z3A20 20 weeks gestation of pregnancy: Secondary | ICD-10-CM

## 2020-02-29 DIAGNOSIS — O360920 Maternal care for other rhesus isoimmunization, second trimester, not applicable or unspecified: Secondary | ICD-10-CM

## 2020-02-29 DIAGNOSIS — Z3402 Encounter for supervision of normal first pregnancy, second trimester: Secondary | ICD-10-CM

## 2020-02-29 DIAGNOSIS — O98512 Other viral diseases complicating pregnancy, second trimester: Secondary | ICD-10-CM

## 2020-02-29 DIAGNOSIS — U071 COVID-19: Secondary | ICD-10-CM

## 2020-02-29 NOTE — Progress Notes (Signed)
Routine Prenatal Care Visit  Virtual Visit via Telephone Note  I connected with Ikea Westall on 02/29/20 at  2:30 PM EST by telephone and verified that I am speaking with the correct person using two identifiers.   I discussed the limitations, risks, security and privacy concerns of performing an evaluation and management service by telephone and the availability of in person appointments. I also discussed with the patient that there may be a patient responsible charge related to this service. The patient expressed understanding and agreed to proceed.  The patient was at home. I spoke with the patient from my office. The names of people involved in this encounter were: Nykiah Searls, and Jobie Quaker.    Subjective  Rebecca Mccann is a 30 y.o. G1P0 at [redacted]w[redacted]d being seen today for ongoing prenatal care.  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  ----------------------------------------------------------------------------------- Patient reports she if feeling significantly better over the last few days from her Covid-19 infection. Patient reports she tested positive on 02/25/20 after an exposure at work. Patient reports +FM. Patient has been unable to f/u with MFM - requesting referral be placed again.  .  .   . Denies leaking of fluid.  ----------------------------------------------------------------------------------- The following portions of the patient's history were reviewed and updated as appropriate: allergies, current medications, past family history, past medical history, past social history, past surgical history and problem list. Problem list updated.  Objective  Last menstrual period 10/10/2019. Pregravid weight 174 lb (78.9 kg) Total Weight Gain  -11 lb (-4.99 kg)  Physical Exam could not be performed. Because of the COVID-19 outbreak this visit was performed over the phone and not in person.   Assessment   30 y.o. G1P0 at [redacted]w[redacted]d by  07/16/2020, by Last Menstrual Period presenting for routine telehealth prenatal visit  Plan   pregnancy 1 Problems (from 01/07/20 to present)    Problem Noted Resolved   Encounter for supervision of normal first pregnancy in first trimester 01/07/2020 by Zipporah Plants, CNM No   Overview Addendum 01/22/2020  5:36 PM by Natale Milch, MD     Nursing Staff Provider  Office Location  [facility] Dating   lmp = 14 wk [place]  Language  English Anatomy US    Flu Vaccine  01/22/2019 Genetic Screen  NIPS:    TDaP vaccine    Hgb A1C or  GTT Third trimester :   Rhogam   not needed   LAB RESULTS   Feeding Plan  Blood Type A/Positive/-- (12/27 1526)   Contraception  Antibody Positive,    Circumcision  Rubella 2.26 (12/27 1526)  Pediatrician   RPR Non Reactive (12/27 1526)   Support Person  HBsAg Negative (12/27 1526)   Prenatal Classes  HIV Non Reactive (12/27 1526)    Varicella  Immune  BTL Consent  GBS  (For PCN allergy, check sensitivities)        VBAC Consent  Pap  NIL 2021    Hgb Electro   negative    CF  negative     SMA  negative    Fragile x  negative         Previous Version      -Patient has been unable to f/u with MFM, requested referral be placed again -Covid-19 with mild presentation -  patient states she is improving, discussed vaccination following infection  Second trimester precautions including but not limited to vaginal bleeding, contractions, leaking of fluid and fetal movement were reviewed in detail with the patient.    Keep previously rescheduled ROB with anatomy scan.  I discussed the assessment and treatment plan with the patient. The patient was provided an opportunity to ask questions and all were answered. The patient agreed with the plan and demonstrated an  understanding of the instructions.   The patient was advised to call back or seek an in-person evaluation if the symptoms worsen or if the condition fails to improve as anticipated.  I provided 8 minutes of non-face-to-face time during this encounter.  Zipporah Plants, CNM, MSN [facility], [facility] 02/29/2020, 2:38 PM

## 2020-03-06 ENCOUNTER — Ambulatory Visit: Payer: Commercial Managed Care - PPO

## 2020-03-07 ENCOUNTER — Encounter: Payer: Commercial Managed Care - PPO | Admitting: Obstetrics

## 2020-03-07 ENCOUNTER — Ambulatory Visit: Payer: Commercial Managed Care - PPO

## 2020-03-13 ENCOUNTER — Ambulatory Visit: Payer: Commercial Managed Care - PPO

## 2020-03-14 ENCOUNTER — Other Ambulatory Visit: Payer: Commercial Managed Care - PPO

## 2020-03-14 ENCOUNTER — Encounter: Payer: Commercial Managed Care - PPO | Admitting: Obstetrics and Gynecology

## 2020-03-17 ENCOUNTER — Encounter: Payer: Commercial Managed Care - PPO | Admitting: Obstetrics and Gynecology

## 2020-03-18 ENCOUNTER — Other Ambulatory Visit: Payer: Self-pay | Admitting: Obstetrics and Gynecology

## 2020-03-18 DIAGNOSIS — O360920 Maternal care for other rhesus isoimmunization, second trimester, not applicable or unspecified: Secondary | ICD-10-CM

## 2020-03-20 ENCOUNTER — Other Ambulatory Visit: Payer: Self-pay

## 2020-03-20 ENCOUNTER — Ambulatory Visit: Payer: Commercial Managed Care - PPO | Attending: Obstetrics

## 2020-03-20 ENCOUNTER — Ambulatory Visit (HOSPITAL_BASED_OUTPATIENT_CLINIC_OR_DEPARTMENT_OTHER): Payer: Commercial Managed Care - PPO

## 2020-03-20 DIAGNOSIS — O360920 Maternal care for other rhesus isoimmunization, second trimester, not applicable or unspecified: Secondary | ICD-10-CM

## 2020-03-20 DIAGNOSIS — Z3A23 23 weeks gestation of pregnancy: Secondary | ICD-10-CM

## 2020-03-20 NOTE — Consult Note (Signed)
MFM Consult Note  Rebecca Mccann was seen in consultation today due to isoimmunization.  She is a gravida 2 para 0 who has screened positive for the anti-E antibody with a titer level of 1:2.  The anti-E antibody titer level has remained stable over the past 2 months.  The patient reports that her first pregnancy 13 to 14 years ago (with a different partner) resulted in a termination of pregnancy at around 8 weeks.  She denies any history of a prior blood transfusion.  She had a cell free DNA test drawn earlier in her current pregnancy that indicated a low risk for trisomy 22, 34, and 13.  The patient did not want the fetal gender revealed today.  The fetal growth and amniotic fluid level appeared appropriate for her gestational age.    There were no obvious fetal anomalies noted on today's exam.  However, the views of the anatomy were limited due to the fetal position.  The limitations of ultrasound in the detection of all anomalies was discussed.   The implications and management of isoimmunization to the E antigen was discussed with the patient today.  She was advised that she was probably exposed to the E antigen from her prior pregnancy.  She was advised that should the fetus that she is carrying have the E antigen on its red blood cells, that the anti-E antibodies in her blood may cross over the placenta and cause fetal anemia.   The patient was advised to have her partner tested to determine if he carries the E antigen on his red blood cells.  Should he be negative for the E antigen, her baby should not be at risk for fetal anemia.  As the patient stated that she does not know if she can get her partner tested for the E antigen, she should continue to have monthly anti-E antibody titer levels monitored.   Should her anti-E antibody titer levels reach a critical titer level of 1:16 or greater or should there be a progressive increase in her anti-E antibody titer levels during her pregnancy, she  should be referred back to our office for measurement of the peak systolic velocity of the middle cerebral artery for detection of fetal anemia via ultrasound.  The patient understands that should fetal anemia be suspected during her prenatal ultrasounds, that she may require in intra uterine fetal blood transfusion or delivery depending on her gestational age.  The peak systolic velocity of the middle cerebral arteries performed today did not indicate that her fetus is anemic at this time.  There were no signs of fetal hydrops noted today.  The patient was reassured that as she most likely was sensitized to the E antigen from her prior pregnancy and this pregnancy was conceived with a different partner, that her chances of having a pregnancy complicated by fetal anemia is probably low.  We will continue to follow her closely with serial ultrasound exams throughout her pregnancy.    A follow-up exam was scheduled in 4 weeks in our office.  We will reassess the fetal anatomy during that exam.    At the end of the consultation, the patient stated that all her questions have been answered to her complete satisfaction.  Thank you for referring this patient for a Maternal-Fetal Medicine consultation.   Total time spent in consultation: 40 minutes.  Recommendations:   Continue monitoring anti-E antibody titer levels every month Have her current partner screened to determine if he carries the E antigen (  if possible) Monthly ultrasounds for fetal assessment Start monitoring the peak systolic velocity of the middle cerebral arteries should the anti-E antibody titer levels reach a critical titer of 1:16 or greater

## 2020-03-21 ENCOUNTER — Ambulatory Visit (INDEPENDENT_AMBULATORY_CARE_PROVIDER_SITE_OTHER): Payer: Commercial Managed Care - PPO | Admitting: Obstetrics and Gynecology

## 2020-03-21 VITALS — Wt 162.0 lb

## 2020-03-21 DIAGNOSIS — Z3A23 23 weeks gestation of pregnancy: Secondary | ICD-10-CM

## 2020-03-21 DIAGNOSIS — O360911 Maternal care for other rhesus isoimmunization, first trimester, fetus 1: Secondary | ICD-10-CM

## 2020-03-21 DIAGNOSIS — Z3401 Encounter for supervision of normal first pregnancy, first trimester: Secondary | ICD-10-CM

## 2020-03-21 DIAGNOSIS — O360920 Maternal care for other rhesus isoimmunization, second trimester, not applicable or unspecified: Secondary | ICD-10-CM

## 2020-03-21 NOTE — Progress Notes (Signed)
I connected with Elliotte Soto  on 03/21/20 at  4:30 PM EST by telephone and verified that I am speaking with the correct person using two identifiers.   I discussed the limitations, risks, security and privacy concerns of performing an evaluation and management service by telephone and the availability of in person appointments. I also discussed with the patient that there may be a patient responsible charge related to this service. The patient expressed understanding and agreed to proceed.  The patient was at home I spoke with the patient from my workstation phone The names of people involved in this encounter were: Rebecca NapAlneshia Belton , and Vena AustriaAndreas Verna Desrocher  Routine Prenatal Care Visit  Subjective  Rebecca Naplneshia Aslinger is a 30 y.o. G1P0 at 3273w2d being seen today for ongoing prenatal care.  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  ----------------------------------------------------------------------------------- Patient reports no complaints.   Contractions: Not present. Vag. Bleeding: None.  Movement: Present. Denies leaking of fluid.  ----------------------------------------------------------------------------------- The following portions of the patient's history were reviewed and updated as appropriate: allergies, current medications, past family history, past medical history, past social history, past surgical history and problem list. Problem list updated.   Objective  Weight 162 lb (73.5 kg), last menstrual period 10/10/2019. Pregravid weight 174 lb (78.9 kg) Total Weight Gain -12 lb (-5.443 kg) Urinalysis:      Fetal Status:     Movement: Present     No physical exam as this was a remote telephone visit to promote social distancing during the current  COVID-19 Pandemic  US MFM OB DETAIL +14 WK  Result Date: 03/20/2020 ----------------------------------------------------------------------  OBSTETRICS REPORT                         (Signed Final 03/20/2020 04:31 pm) ---------------------------------------------------------------------- Patient Info  ID #:        [id number]                          D.O.B.:  [birth year] (30 yrs)  Name:        Rebecca Mccann                 Visit Date: 03/20/2020 03:01 pm ---------------------------------------------------------------------- Performed By  Attending:         Ma RingsVictor Fang MD         Referred By:       Zipporah PlantsKATELYN VEAL  Performed By:      Criselda PeachesJulie Faucette RDMS    [facility] ---------------------------------------------------------------------- Orders  #   Description                          Code         Ordered By  1   [place] MFM OB DETAIL +14 WK              [id number]     KATELYN VEAL ----------------------------------------------------------------------  #   Order #                    Accession #                 Episode #  1   [id number]                  [id number]                  [id number] ---------------------------------------------------------------------- Indications  Anti-E isoimmunization affecting pregnancy in   O36.0920  2nd trimester  [redacted] weeks gestation of pregnancy                 Z3A.23 ---------------------------------------------------------------------- Fetal Evaluation  Num Of Fetuses:          1  Fetal Heart Rate(bpm):   145  Cardiac Activity:        Observed  Presentation:            Cephalic  Placenta:                Anterior  P. Cord Insertion:       Visualized, central                              Largest Pocket(cm)                              4.5 ---------------------------------------------------------------------- Biometry   BPD:      55.1   mm     G. Age:  22w 6d         32  %    CI:         71.91  %    70 - 86                                                           FL/HC:       19.1  %    19.2 - 20.8  HC:      206.8   mm     G. Age:  22w 5d         22  %    HC/AC:       1.09       1.05 - 1.21  AC:      190.3   mm     G. Age:  23w 5d         62  %    FL/BPD:      71.9  %    71 - 87  FL:       39.6   mm     G. Age:  22w 5d         26  %  FL/AC:       20.8  %    20 - 24  HUM:      37.3   mm     G. Age:  23w 0d         41  %  CER:      23.3   mm     G. Age:  21w 4d         29  %  LV:         4.1  mm  CM:         4.7  mm  Est. FW:     577   gm      1 lb 4 oz     48  % ---------------------------------------------------------------------- OB History  Gravidity:     1         Term:  0          Prem:  0        SAB:   0  TOP:           0       Ectopic: 0         Living: 0 ---------------------------------------------------------------------- Gestational Age  LMP:            23w 1d       Date:  10/10/19                   EDD:  07/16/20  U/S Today:      23w 0d                                         EDD:  07/17/20  Best:           23w 1d    Det. By:  LMP  (10/10/19)            EDD:  07/16/20 ---------------------------------------------------------------------- Anatomy  Cranium:                Appears normal         Aortic Arch:            Appears normal  Cavum:                  Appears normal         Ductal Arch:            Appears normal  Ventricles:             Appears normal         Diaphragm:              Appears normal  Choroid Plexus:         Appears normal         Stomach:                Appears normal, left                                                                         sided  Cerebellum:             Appears  normal         Abdomen:                Appears normal  Posterior Fossa:        Appears normal         Abdominal Wall:         Appears nml (cord                                                                          insert, abd wall)  Nuchal Fold:            Not applicable (>20    Cord Vessels:           Appears normal ([redacted]                          wks GA)                                        vessel cord)  Face:                   Appears normal         Kidneys:                Appear normal                          (orbits and profile)  Lips:                   Not well visualized    Bladder:                Appears normal  Thoracic:               Appears normal         Spine:                  Appears normal  Heart:                  Appears normal         Upper Extremities:      Appears normal                          (4CH, axis, and situs)  RVOT:                   Appears normal         Lower Extremities:      Appears normal  LVOT:                   Appears normal  Other:   Heels/feet and open hands/5th digits visualized. ---------------------------------------------------------------------- Doppler - Fetal Vessels  Middle Cerebral Artery  PSV    MoM                                                     (cm/s)                                                          38   1.3 ---------------------------------------------------------------------- Cervix Uterus Adnexa  Cervix  Length:            3.72  cm. ---------------------------------------------------------------------- Comments  Georgina Pillion Randa was seen in consultation today due to  isoimmunization.  She is a gravida 2 para 0 who has screened  positive for the anti-E antibody with a titer level of 1:2.  The anti-  E antibody titer level has remained stable over the past 2  months.  The patient reports that her first pregnancy 13 to 14  years ago (with a different partner) resulted in a termination of  pregnancy at around 8 weeks.  She denies any history of a  prior blood transfusion.  She had a cell free DNA test drawn earlier in her current  pregnancy that indicated a low risk for trisomy 31, 51, and 13.  The patient did not  want the fetal gender revealed today.  The fetal growth and amniotic fluid level appeared appropriate  for her gestational age.  There were no obvious fetal anomalies noted on today's exam.  However, the views of the anatomy were limited due to the fetal  position.  The limitations of ultrasound in the detection of all  anomalies was discussed.  The implications and management of isoimmunization to the E  antigen was discussed with the patient today.  She was  advised that she was probably exposed to the E antigen from  her prior pregnancy.  She was advised that should the fetus  that she is carrying have the E antigen on its red blood cells,  that the anti-E antibodies in her blood may cross over the  placenta and cause fetal anemia.  The patient was advised to have her partner tested to determine  if he carries the E antigen on his red blood cells.  Should he be  negative for the E antigen, her baby should not be at risk for  fetal anemia.  As the patient stated that she does not know if  she can get her partner tested for the E antigen, she should  continue to have monthly anti-E antibody titer levels monitored.  Should her anti-E antibody titer levels reach a critical titer level  of 1:16 or greater or should there be a progressive increase in  her anti-E antibody titer levels during her pregnancy, she should  be referred back to our office for measurement of the peak  systolic velocity of the middle cerebral artery for detection of  fetal anemia via ultrasound.  The patient understands that  should fetal anemia be suspected during her prenatal  ultrasounds, that she may require in intra uterine fetal blood  transfusion or delivery depending on her gestational age.  The peak systolic velocity of the middle cerebral arteries  performed today did not indicate that her  fetus is anemic at this  time.  There were no signs of fetal hydrops noted today.  The  patient was reassured that as she most likely was sensitized  to  the E antigen from her prior pregnancy and this pregnancy was  conceived with a different partner, that her chances of having a  pregnancy complicated by fetal anemia is probably low.  We will continue to follow her closely with serial ultrasound  exams throughout her pregnancy.  A follow-up exam was scheduled in 4 weeks in our office.  We  will reassess the fetal anatomy during that exam.  At the end of the consultation, the patient stated that all her  questions have been answered to her complete satisfaction.  Thank you for referring this patient for a Maternal-Fetal Medicine  consultation.  Total time spent in consultation: 40 minutes. ---------------------------------------------------------------------- Recommendations  Continue monitoring anti-E antibody titer levels every month  Have her current partner screened to determine if he carries the  E antigen (if possible)  Monthly ultrasounds for fetal assessment  Start monitoring the peak systolic velocity of the middle cerebral  arteries should the anti-E antibody titer levels reach a critical  titer of 1:16 or greater ----------------------------------------------------------------------                    Ma Rings, MD Electronically Signed Final Report   03/20/2020 04:31 pm ----------------------------------------------------------------------    Assessment   30 y.o. G1P0 at [redacted]w[redacted]d by  07/16/2020, by Last Menstrual Period presenting for routine prenatal visit  Plan   pregnancy 1 Problems (from 01/07/20 to present)    Problem Noted Resolved   Encounter for supervision of normal first pregnancy in first trimester 01/07/2020 by Zipporah Plants, CNM No   Overview Addendum 03/21/2020  8:12 AM by Zipporah Plants, CNM     Nursing Staff Provider  Office Location  [facility] Dating   lmp = 14 wk [place]  Language  English Anatomy US    Flu Vaccine  01/22/2019 Genetic Screen  NIPS:    TDaP vaccine    Hgb A1C or  GTT Third trimester :   Rhogam   not needed    LAB RESULTS   Feeding Plan  Blood Type A/Positive/-- (12/27 1526)   Contraception  Antibody Positive,    Circumcision  Rubella 2.26 (12/27 1526)  Pediatrician   RPR Non Reactive (12/27 1526)   Support Person  HBsAg Negative (12/27 1526)   Prenatal Classes  HIV Non Reactive (12/27 1526)    Varicella  Immune  BTL Consent  GBS  (For PCN allergy, check sensitivities)        VBAC Consent  Pap  NIL 2021    Hgb Electro   negative    CF  negative     SMA  negative    Fragile x  negative   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 -   Continue monitoring anti-E antibody titer levels every month  Have her current partner screened to determine if he carries the  E antigen (if possible)  Monthly ultrasounds for fetal assessment  Start monitoring the peak systolic velocity of the middle cerebral  arteries should the anti-E antibody titer levels reach a critical  titer of 1:16 or greater      Previous Version       Gestational age appropriate obstetric precautions including but not limited to vaginal bleeding, contractions, leaking of fluid and fetal movement were reviewed in detail with the patient.    -  MFM [place] 03/20/2020 normal no signs of fetal anemia.  Patient phone visit today.  Last anti-E level   1/25 would recommend repeating prior to 28 week labs  Return in about 2 weeks (around 04/04/2020) for ROB 2 week, 4 weeks ROB and 28 week labs.  Vena Austria, MD, [facility], [facility] 03/21/2020, 5:10 PM

## 2020-04-04 ENCOUNTER — Ambulatory Visit (INDEPENDENT_AMBULATORY_CARE_PROVIDER_SITE_OTHER): Payer: Commercial Managed Care - PPO | Admitting: Obstetrics and Gynecology

## 2020-04-04 ENCOUNTER — Other Ambulatory Visit: Payer: Self-pay

## 2020-04-04 VITALS — BP 110/72 | Ht 64.0 in | Wt 166.2 lb

## 2020-04-04 DIAGNOSIS — O360911 Maternal care for other rhesus isoimmunization, first trimester, fetus 1: Secondary | ICD-10-CM

## 2020-04-04 DIAGNOSIS — Z3A25 25 weeks gestation of pregnancy: Secondary | ICD-10-CM

## 2020-04-04 LAB — POCT URINALYSIS DIPSTICK OB
Glucose, UA: NEGATIVE
POC,PROTEIN,UA: NEGATIVE

## 2020-04-04 NOTE — Addendum Note (Signed)
Addended by: Liliane Shi on: 04/04/2020 04:52 PM   Modules accepted: Orders

## 2020-04-04 NOTE — Progress Notes (Signed)
Routine Prenatal Care Visit  Subjective  Rebecca Mccann is a 30 y.o. G1P0 at [redacted]w[redacted]d being seen today for ongoing prenatal care.  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  ----------------------------------------------------------------------------------- Patient reports no complaints.   Contractions: Not present. Vag. Bleeding: None.  Movement: Present. Denies leaking of fluid.  ----------------------------------------------------------------------------------- The following portions of the patient's history were reviewed and updated as appropriate: allergies, current medications, past family history, past medical history, past social history, past surgical history and problem list. Problem list updated.   Objective  Blood pressure 110/72, height 5\' 4"  (1.626 m), weight 166 lb 3.2 oz (75.4 kg), last menstrual period 10/10/2019. Pregravid weight 174 lb (78.9 kg) Total Weight Gain -7 lb 12.8 oz (-3.538 kg) Urinalysis:      Fetal Status: Fetal Heart Rate (bpm): 140 Fundal Height: 25 cm Movement: Present     General:  Alert, oriented and cooperative. Patient is in no acute distress.  Skin: Skin is warm and dry. No rash noted.   Cardiovascular: Normal heart rate noted  Respiratory: Normal respiratory effort, no problems with respiration noted  Abdomen: Soft, gravid, appropriate for gestational age. Pain/Pressure: Absent     Pelvic:  Cervical exam deferred        Extremities: Normal range of motion.  Edema: None  Mental Status: Normal mood and affect. Normal behavior. Normal judgment and thought content.     Assessment   30 y.o. G1P0 at [redacted]w[redacted]d by  07/16/2020, by Last Menstrual Period presenting for routine prenatal visit  Plan   pregnancy 1 Problems (from  01/07/20 to present)    Problem Noted Resolved   Encounter for supervision of normal first pregnancy in first trimester 01/07/2020 by 01/09/2020, CNM No   Overview Addendum 03/21/2020  8:12 AM by 05/21/2020, CNM     Nursing Staff Provider  Office Location  [facility] Dating   lmp = 14 wk Zipporah Plants  Language  English Anatomy US    Flu Vaccine  01/22/2019 Genetic Screen  NIPS:    TDaP vaccine    Hgb A1C or  GTT Third trimester :   Rhogam   not needed   LAB RESULTS   Feeding Plan  Blood Type A/Positive/-- (12/27 1526)   Contraception  Antibody Positive,    Circumcision  Rubella 2.26 (12/27 1526)  Pediatrician   RPR Non Reactive (12/27 1526)   Support Person  HBsAg Negative (12/27 1526)   Prenatal Classes  HIV Non Reactive (12/27 1526)    Varicella  Immune  BTL Consent  GBS  (For PCN allergy, check sensitivities)        VBAC Consent  Pap  NIL 2021    Hgb Electro   negative    CF  negative     SMA  negative    Fragile x  negative   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  Has [place] planned with MFM to follow baby growth.   Gestational age appropriate obstetric precautions including but not limited to vaginal bleeding, contractions, leaking of fluid and fetal movement were reviewed in detail with the patient.    Return in about 3 weeks (around 04/25/2020) for labs early next week and in 3 weeks ROB in person and 1 hr GTT testing.  Natale Milch MD [facility], [facility] 04/04/2020, 4:42 PM

## 2020-04-07 ENCOUNTER — Other Ambulatory Visit: Payer: Self-pay | Admitting: Obstetrics and Gynecology

## 2020-04-07 DIAGNOSIS — O360920 Maternal care for other rhesus isoimmunization, second trimester, not applicable or unspecified: Secondary | ICD-10-CM

## 2020-04-17 ENCOUNTER — Ambulatory Visit: Payer: Commercial Managed Care - PPO | Attending: Maternal & Fetal Medicine

## 2020-04-17 ENCOUNTER — Other Ambulatory Visit: Payer: Self-pay

## 2020-04-17 DIAGNOSIS — O321XX0 Maternal care for breech presentation, not applicable or unspecified: Secondary | ICD-10-CM | POA: Diagnosis not present

## 2020-04-17 DIAGNOSIS — O360920 Maternal care for other rhesus isoimmunization, second trimester, not applicable or unspecified: Secondary | ICD-10-CM | POA: Diagnosis present

## 2020-04-17 DIAGNOSIS — Z3401 Encounter for supervision of normal first pregnancy, first trimester: Secondary | ICD-10-CM

## 2020-04-17 DIAGNOSIS — Z3A27 27 weeks gestation of pregnancy: Secondary | ICD-10-CM | POA: Diagnosis not present

## 2020-04-18 ENCOUNTER — Ambulatory Visit (INDEPENDENT_AMBULATORY_CARE_PROVIDER_SITE_OTHER): Payer: Commercial Managed Care - PPO | Admitting: Obstetrics and Gynecology

## 2020-04-18 ENCOUNTER — Other Ambulatory Visit: Payer: Commercial Managed Care - PPO

## 2020-04-18 VITALS — BP 98/52 | Wt 162.0 lb

## 2020-04-18 DIAGNOSIS — Z3401 Encounter for supervision of normal first pregnancy, first trimester: Secondary | ICD-10-CM

## 2020-04-18 DIAGNOSIS — Z3A27 27 weeks gestation of pregnancy: Secondary | ICD-10-CM

## 2020-04-18 DIAGNOSIS — O360911 Maternal care for other rhesus isoimmunization, first trimester, fetus 1: Secondary | ICD-10-CM

## 2020-04-18 DIAGNOSIS — Z369 Encounter for antenatal screening, unspecified: Secondary | ICD-10-CM

## 2020-04-18 LAB — POCT URINALYSIS DIPSTICK OB
Glucose, UA: NEGATIVE
POC,PROTEIN,UA: NEGATIVE

## 2020-04-18 NOTE — Progress Notes (Signed)
Routine Prenatal Care Visit  Subjective  Rebecca Mccann is a 30 y.o. G1P0 at [redacted]w[redacted]d being seen today for ongoing prenatal care.  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  ----------------------------------------------------------------------------------- Patient reports no complaints.   Contractions: Not present. Vag. Bleeding: None.  Movement: Present. Denies leaking of fluid.  ----------------------------------------------------------------------------------- The following portions of the patient's history were reviewed and updated as appropriate: allergies, current medications, past family history, past medical history, past social history, past surgical history and problem list. Problem list updated.   Objective  Blood pressure (!) 98/52, weight 162 lb (73.5 kg), last menstrual period 10/10/2019. Pregravid weight 174 lb (78.9 kg) Total Weight Gain -12 lb (-5.443 kg) Urinalysis:      Fetal Status: Fetal Heart Rate (bpm): 135 Fundal Height: 27 cm Movement: Present     General:  Alert, oriented and cooperative. Patient is in no acute distress.  Skin: Skin is warm and dry. No rash noted.   Cardiovascular: Normal heart rate noted  Respiratory: Normal respiratory effort, no problems with respiration noted  Abdomen: Soft, gravid, appropriate for gestational age. Pain/Pressure: Absent     Pelvic:  Cervical exam deferred        Extremities: Normal range of motion.     ental Status: Normal mood and affect. Normal behavior. Normal judgment and thought content.     Assessment   30 y.o. G1P0 at [redacted]w[redacted]d by  07/16/2020, by Last Menstrual Period presenting for routine prenatal visit  Plan   pregnancy 1 Problems (from 01/07/20 to present)    Problem Noted  Resolved   Encounter for supervision of normal first pregnancy in first trimester 01/07/2020 by Zipporah Plants, CNM No   Overview Addendum 03/21/2020  8:12 AM by Zipporah Plants, CNM     Nursing Staff Provider  Office Location  [facility] Dating   lmp = 14 wk [place]  Language  English Anatomy US    Flu Vaccine  01/22/2019 Genetic Screen  NIPS:    TDaP vaccine    Hgb A1C or  GTT Third trimester :   Rhogam   not needed   LAB RESULTS   Feeding Plan  Blood Type A/Positive/-- (12/27 1526)   Contraception  Antibody Positive,    Circumcision  Rubella 2.26 (12/27 1526)  Pediatrician   RPR Non Reactive (12/27 1526)   Support Person  HBsAg Negative (12/27 1526)   Prenatal Classes  HIV Non Reactive (12/27 1526)    Varicella  Immune  BTL Consent  GBS  (For PCN allergy, check sensitivities)        VBAC Consent  Pap  NIL 2021    Hgb Electro   negative    CF  negative     SMA  negative    Fragile x  negative   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    - repeat antibody screen today - 28 week labs  Return in about 2 weeks (around 05/02/2020) for ROB.  Vena Austria, MD, Evern Core [facility], [facility] 04/18/2020, 10:37 AM

## 2020-04-22 LAB — 28 WEEKS RH-PANEL
Basophils Absolute: 0 10*3/uL (ref 0.0–0.2)
Basos: 0 %
EOS (ABSOLUTE): 0 10*3/uL (ref 0.0–0.4)
Eos: 0 %
Gestational Diabetes Screen: 105 mg/dL (ref 65–139)
HIV Screen 4th Generation wRfx: NONREACTIVE
Hematocrit: 34 % (ref 34.0–46.6)
Hemoglobin: 11 g/dL — ABNORMAL LOW (ref 11.1–15.9)
Immature Grans (Abs): 0 10*3/uL (ref 0.0–0.1)
Immature Granulocytes: 0 %
Lymphocytes Absolute: 1.2 10*3/uL (ref 0.7–3.1)
Lymphs: 24 %
MCH: 29.3 pg (ref 26.6–33.0)
MCHC: 32.4 g/dL (ref 31.5–35.7)
MCV: 91 fL (ref 79–97)
Monocytes Absolute: 0.3 10*3/uL (ref 0.1–0.9)
Monocytes: 6 %
Neutrophils Absolute: 3.4 10*3/uL (ref 1.4–7.0)
Neutrophils: 70 %
Platelets: 213 10*3/uL (ref 150–450)
RBC: 3.75 x10E6/uL — ABNORMAL LOW (ref 3.77–5.28)
RDW: 13 % (ref 11.7–15.4)
RPR Ser Ql: NONREACTIVE
WBC: 5 10*3/uL (ref 3.4–10.8)

## 2020-04-22 LAB — AB SCR+ANTIBODY ID: Antibody Screen: POSITIVE — AB

## 2020-04-28 ENCOUNTER — Ambulatory Visit (INDEPENDENT_AMBULATORY_CARE_PROVIDER_SITE_OTHER): Payer: Commercial Managed Care - PPO | Admitting: Advanced Practice Midwife

## 2020-04-28 ENCOUNTER — Encounter: Payer: Self-pay | Admitting: Advanced Practice Midwife

## 2020-04-28 ENCOUNTER — Other Ambulatory Visit: Payer: Self-pay

## 2020-04-28 ENCOUNTER — Other Ambulatory Visit: Payer: Commercial Managed Care - PPO

## 2020-04-28 VITALS — BP 120/80 | Wt 166.0 lb

## 2020-04-28 DIAGNOSIS — Z1379 Encounter for other screening for genetic and chromosomal anomalies: Secondary | ICD-10-CM

## 2020-04-28 DIAGNOSIS — Z3403 Encounter for supervision of normal first pregnancy, third trimester: Secondary | ICD-10-CM

## 2020-04-28 DIAGNOSIS — Z3A28 28 weeks gestation of pregnancy: Secondary | ICD-10-CM

## 2020-04-28 LAB — POCT URINALYSIS DIPSTICK OB
Glucose, UA: NEGATIVE
POC,PROTEIN,UA: NEGATIVE

## 2020-04-28 NOTE — Addendum Note (Signed)
Addended by: Cornelius Moras D on: 04/28/2020 03:34 PM   Modules accepted: Orders

## 2020-04-28 NOTE — Progress Notes (Signed)
Routine Prenatal Care Visit  Subjective  Melvin Marmo is a 30 y.o. G1P0 at [redacted]w[redacted]d being seen today for ongoing prenatal care.  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  ----------------------------------------------------------------------------------- Patient reports no complaints.   Contractions: Not present. Vag. Bleeding: None.  Movement: Present. Leaking Fluid denies.  ----------------------------------------------------------------------------------- The following portions of the patient's history were reviewed and updated as appropriate: allergies, current medications, past family history, past medical history, past social history, past surgical history and problem list. Problem list updated.  Objective  Blood pressure 120/80, weight 166 lb (75.3 kg), last menstrual period 10/10/2019. Pregravid weight 174 lb (78.9 kg) Total Weight Gain -8 lb (-3.629 kg) Urinalysis: Urine Protein    Urine Glucose    Fetal Status: Fetal Heart Rate (bpm): 127 Fundal Height: 29 cm Movement: Present     General:  Alert, oriented and cooperative. Patient is in no acute distress.  Skin: Skin is warm and dry. No rash noted.   Cardiovascular: Normal heart rate noted  Respiratory: Normal respiratory effort, no problems with respiration noted  Abdomen: Soft, gravid, appropriate for gestational age. Pain/Pressure: Absent     Pelvic:  Cervical exam deferred        Extremities: Normal range of motion.  Edema: None  Mental Status: Normal mood and affect. Normal behavior. Normal judgment and thought content.   Assessment   30 y.o. G1P0 at [redacted]w[redacted]d by  07/16/2020, by Last Menstrual Period presenting for routine prenatal visit  Plan   pregnancy 1 Problems (from 01/07/20 to present)     Problem Noted Resolved   Encounter for supervision of normal first pregnancy in first trimester 01/07/2020 by Zipporah Plants, CNM No   Overview Addendum 03/21/2020  8:12 AM by Zipporah Plants, CNM     Nursing Staff Provider  Office Location  [facility] Dating   lmp = 14 wk [place]  Language  English Anatomy US    Flu Vaccine  01/22/2019 Genetic Screen  NIPS:    TDaP vaccine    Hgb A1C or  GTT Third trimester :   Rhogam   not needed   LAB RESULTS   Feeding Plan  Blood Type A/Positive/-- (12/27 1526)   Contraception  Antibody Positive,    Circumcision  Rubella 2.26 (12/27 1526)  Pediatrician   RPR Non Reactive (12/27 1526)   Support Person  HBsAg Negative (12/27 1526)   Prenatal Classes  HIV Non Reactive (12/27 1526)    Varicella  Immune  BTL Consent  GBS  (For PCN allergy, check sensitivities)        VBAC Consent  Pap  NIL 2021    Hgb Electro   negative    CF  negative     SMA  negative    Fragile x  negative   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 Please refer to After Visit Summary for other counseling recommendations.   Return in about 2 weeks (around 05/12/2020) for rob.  Tresea Mall, CNM 04/28/2020 3:22 PM

## 2020-04-28 NOTE — Patient Instructions (Signed)

## 2020-05-05 ENCOUNTER — Other Ambulatory Visit: Payer: Self-pay | Admitting: Obstetrics and Gynecology

## 2020-05-05 DIAGNOSIS — O360920 Maternal care for other rhesus isoimmunization, second trimester, not applicable or unspecified: Secondary | ICD-10-CM

## 2020-05-12 ENCOUNTER — Encounter: Payer: Commercial Managed Care - PPO | Admitting: Obstetrics

## 2020-05-13 ENCOUNTER — Other Ambulatory Visit: Payer: Self-pay

## 2020-05-13 ENCOUNTER — Ambulatory Visit: Payer: Commercial Managed Care - PPO | Attending: Maternal & Fetal Medicine

## 2020-05-13 DIAGNOSIS — Z3A30 30 weeks gestation of pregnancy: Secondary | ICD-10-CM | POA: Insufficient documentation

## 2020-05-13 DIAGNOSIS — O360920 Maternal care for other rhesus isoimmunization, second trimester, not applicable or unspecified: Secondary | ICD-10-CM | POA: Insufficient documentation

## 2020-05-13 DIAGNOSIS — O360930 Maternal care for other rhesus isoimmunization, third trimester, not applicable or unspecified: Secondary | ICD-10-CM | POA: Diagnosis not present

## 2020-05-13 IMAGING — US US MFM OB FOLLOW-UP
1 series · 14 of 28 positions shown · non-contrast
Comparison: none

[Series 1: us mfm ob follow-up · 14 of 59 slices shown]
[im 3/59]
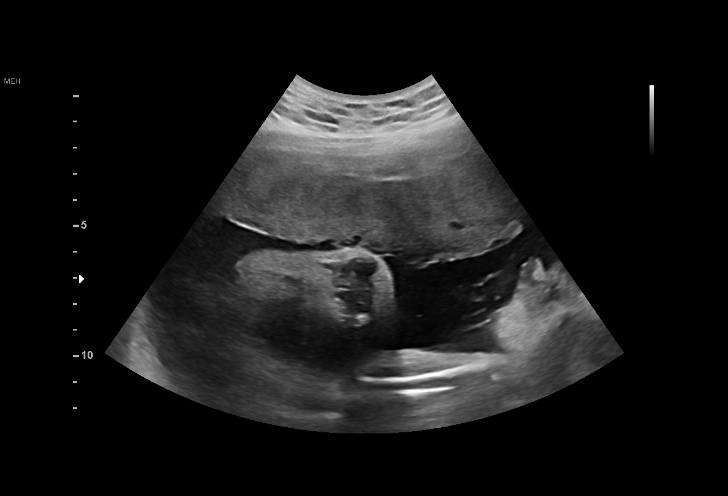
[im 7/59]
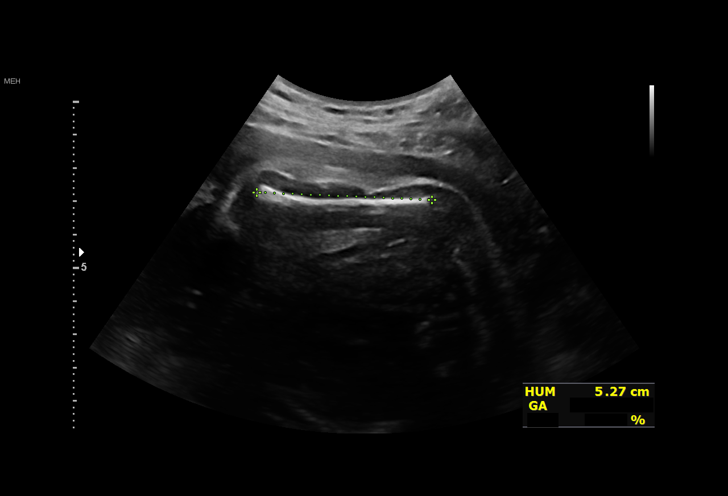
[im 11/59]
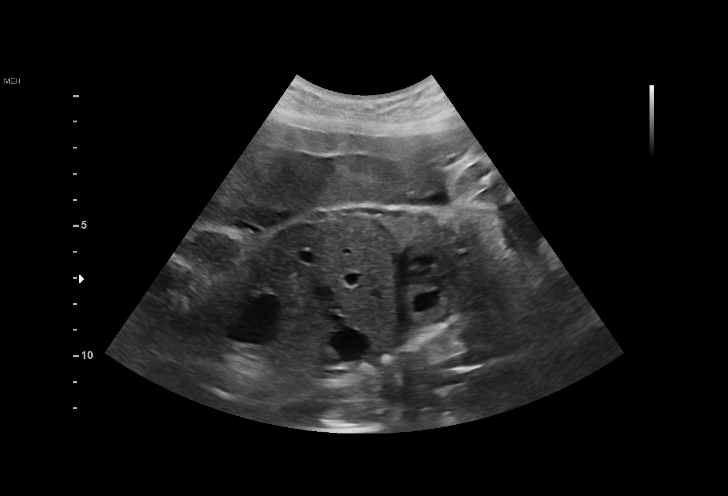
[im 16/59]
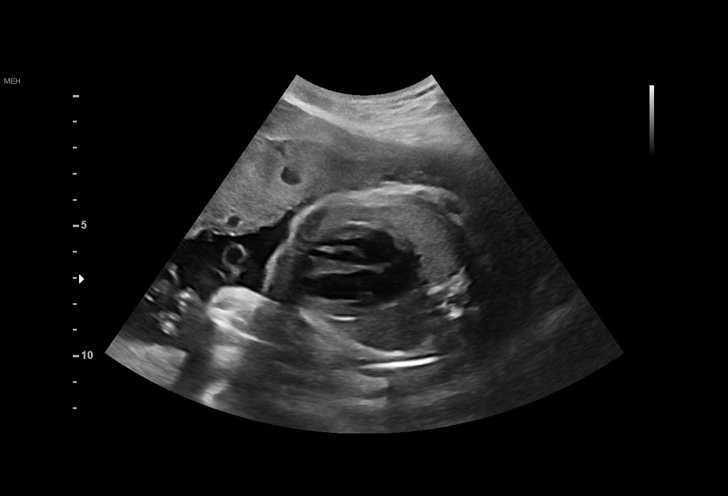
[im 20/59]
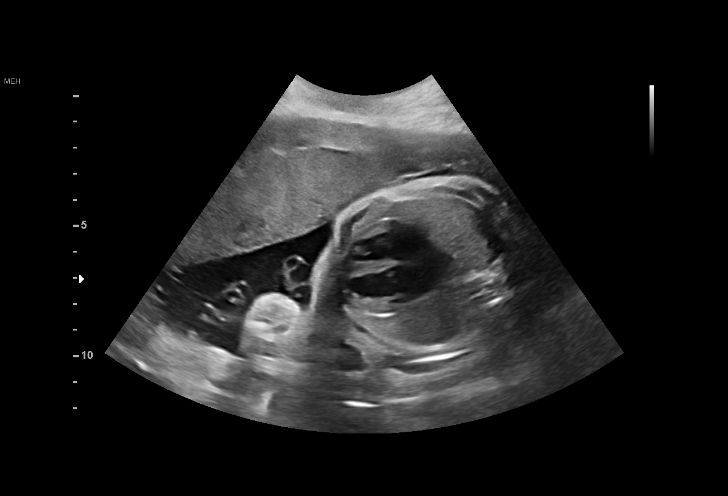
[im 24/59]
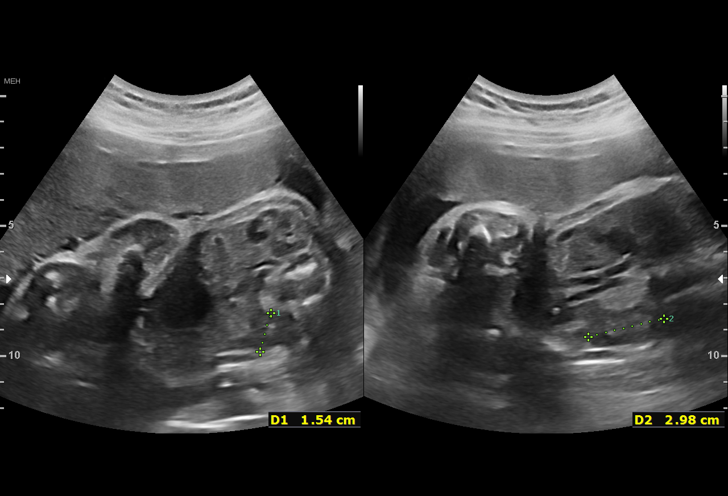
[im 28/59]
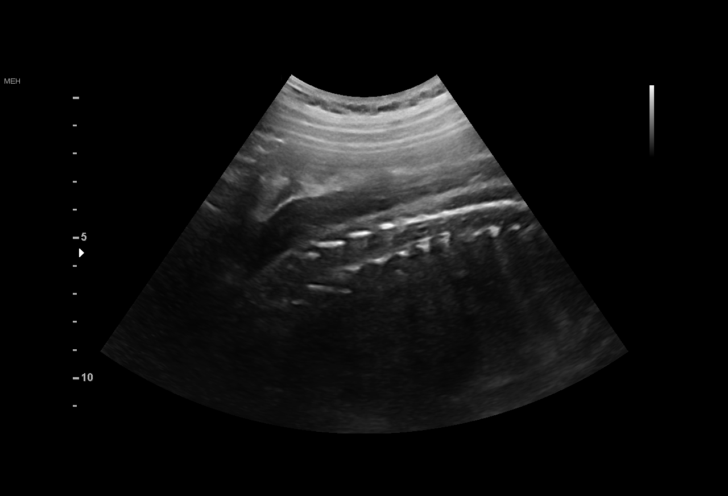
[im 33/59]
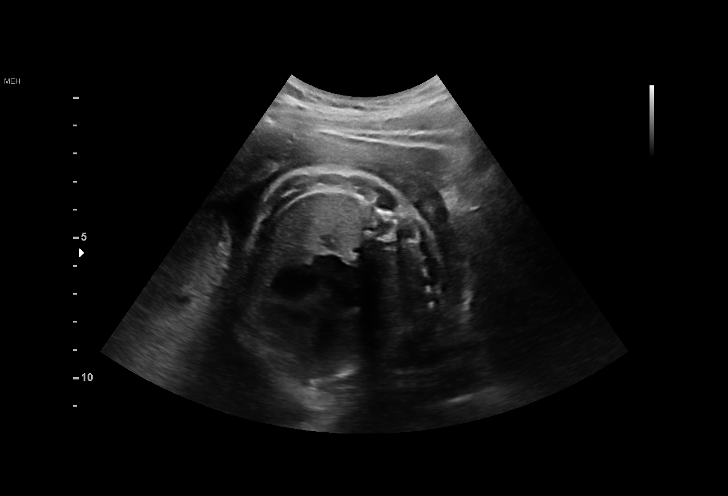
[im 37/59]
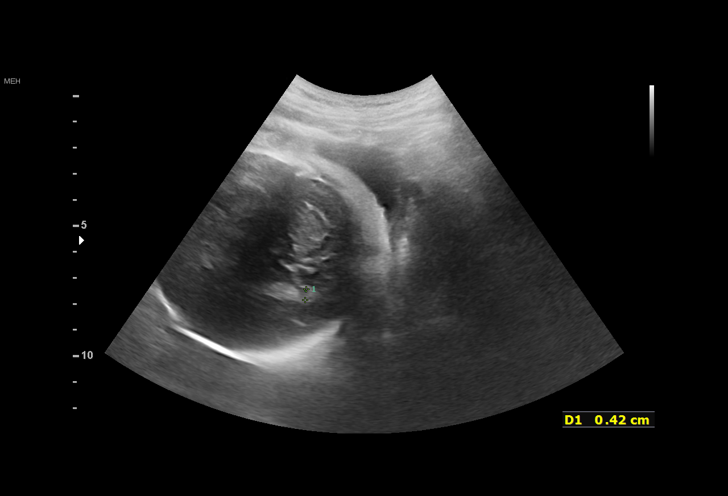
[im 41/59]
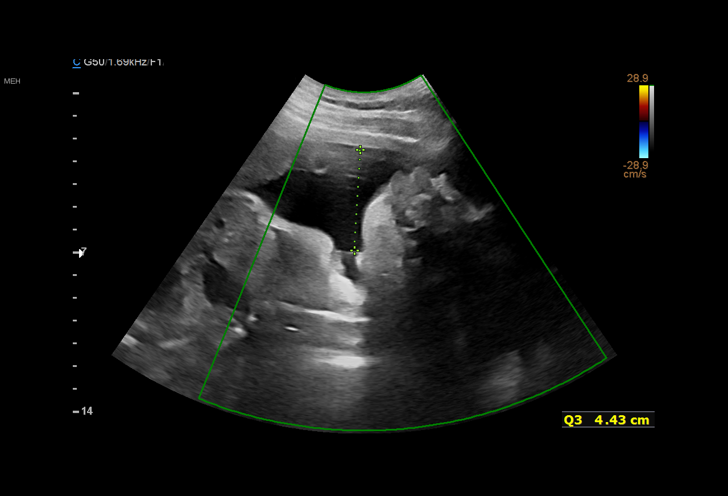
[im 46/59]
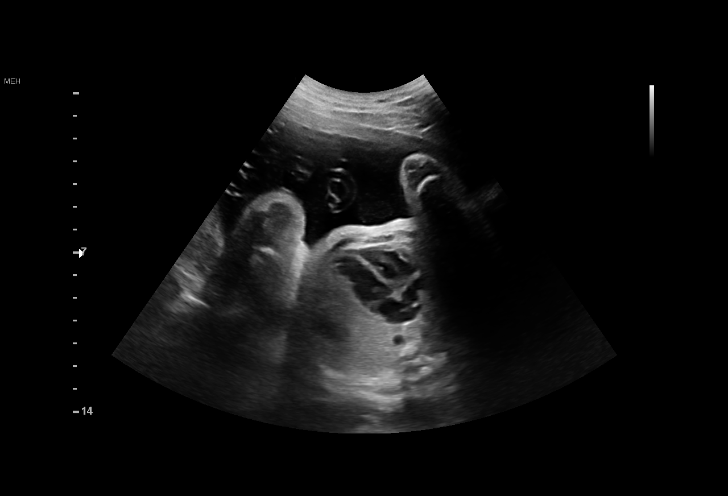
[im 50/59]
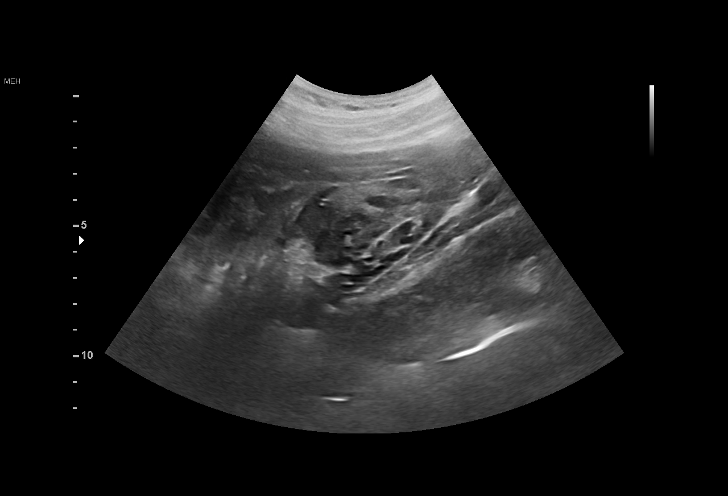
[im 54/59]
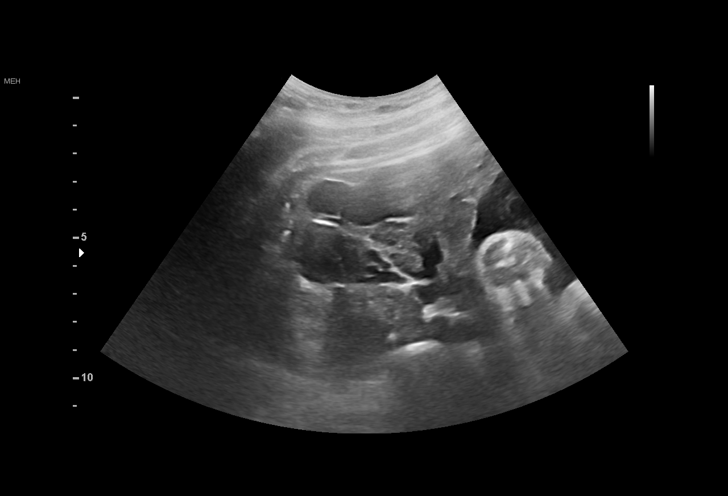
[im 59/59]
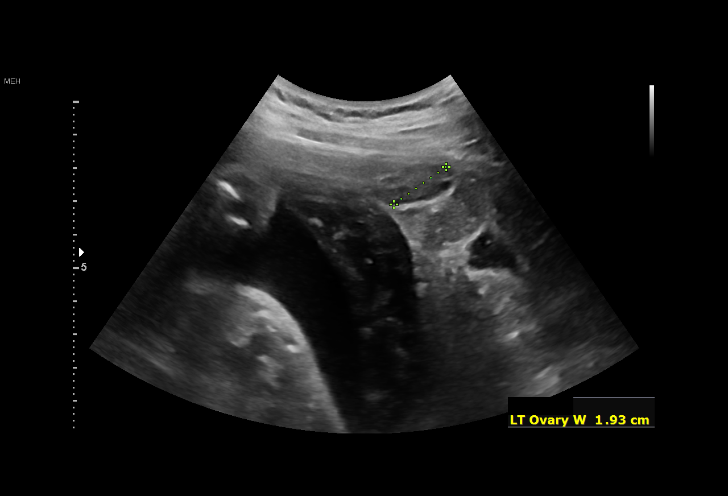

[14 of 28 positions shown; findings below may reference images not displayed]

Indications

 30 weeks gestation of pregnancy
 Postive antibody E
Fetal Evaluation

 Num Of Fetuses:         1
 Fetal Heart Rate(bpm):  137
 Cardiac Activity:       Observed
 Presentation:           Cephalic
 Placenta:               Anterior

 Amniotic Fluid
 AFI FV:      Within normal limits

 AFI Sum(cm)     %Tile
 13.1            39
Biometry

 BPD:      76.3  mm     G. Age:  30w 4d         31  %    CI:        72.29   %    70 - 86
                                                         FL/HC:      20.7   %    19.3 -
 HC:      285.5  mm     G. Age:  31w 3d         27  %    HC/AC:      1.13        0.96 -
 AC:      253.6  mm     G. Age:  29w 4d         13  %    FL/BPD:     77.3   %    71 - 87
 FL:         59  mm     G. Age:  30w 5d         33  %    FL/AC:      23.3   %    20 - 24
 HUM:        53  mm     G. Age:  30w 6d         53  %

 Est. FW:    [S9]  gm      3 lb 6 oz     19  %
OB History

 Gravidity:    1         Term:   0        Prem:   0        SAB:   0
 TOP:          0       Ectopic:  0        Living: 0
Gestational Age

 LMP:           30w 6d        Date:  [DATE]                 EDD:   [DATE]
 U/S Today:     30w 4d                                        EDD:   [DATE]
 Best:          30w 6d     Det. By:  LMP  ([DATE])          EDD:   [DATE]
Anatomy

 Cranium:               Previously seen        Aortic Arch:            Previously seen
 Cavum:                 Previously seen        Ductal Arch:            Appears normal
 Ventricles:            Previously seen        Diaphragm:              Appears normal
 Choroid Plexus:        Previously seen        Stomach:                Appears normal, left
                                                                       sided
 Cerebellum:            Previously seen        Abdomen:                Appears normal
 Posterior Fossa:       Previously seen        Abdominal Wall:         Previously seen
 Nuchal Fold:           Previously seen        Cord Vessels:           Previously seen
 Face:                  Appears normal         Kidneys:                Appear normal
                        (orbits and profile)
 Lips:                  Previously seen        Bladder:                Appears normal
 Thoracic:              Previously seen        Spine:                  Previously seen
 Heart:                 Appears normal         Upper Extremities:      Previously seen
                        (4CH, axis, and
                        situs)
 RVOT:                  Appears normal         Lower Extremities:      Previously seen
 LVOT:                  Appears normal
Cervix Uterus Adnexa

 Right Ovary
 Size(cm)       3.3  x   1.7    x  1.9       Vol(ml):

 Left Ovary
 Size(cm)       3.2  x   1.2    x  1.9       Vol(ml):
Impression

 Follow up growth due to anti E antibodies. Most recent titer
 was too weak to titer.
 Normal interval growth with measurements consistent with
 dates
 Good fetal movement and amniotic fluid volume
Recommendations

 Continue serial assessments of Anti-E antibody.
 Follow up growth as clinically indicated.
 If anti-E antibody increases above [DATE] order MCA Dopplers.

## 2020-06-02 ENCOUNTER — Ambulatory Visit (INDEPENDENT_AMBULATORY_CARE_PROVIDER_SITE_OTHER): Payer: Commercial Managed Care - PPO | Admitting: Advanced Practice Midwife

## 2020-06-02 ENCOUNTER — Other Ambulatory Visit: Payer: Self-pay

## 2020-06-02 ENCOUNTER — Encounter: Payer: Self-pay | Admitting: Advanced Practice Midwife

## 2020-06-02 VITALS — BP 120/80 | Wt 172.0 lb

## 2020-06-02 DIAGNOSIS — Z3403 Encounter for supervision of normal first pregnancy, third trimester: Secondary | ICD-10-CM

## 2020-06-02 DIAGNOSIS — Z369 Encounter for antenatal screening, unspecified: Secondary | ICD-10-CM

## 2020-06-02 DIAGNOSIS — O360930 Maternal care for other rhesus isoimmunization, third trimester, not applicable or unspecified: Secondary | ICD-10-CM

## 2020-06-02 DIAGNOSIS — Z3A33 33 weeks gestation of pregnancy: Secondary | ICD-10-CM

## 2020-06-02 NOTE — Progress Notes (Signed)
Routine Prenatal Care Visit  Subjective  Rebecca Mccann is a 30 y.o. G1P0 at [redacted]w[redacted]d being seen today for ongoing prenatal care.  She is currently monitored for the following issues for this low-risk pregnancy and has Encounter for supervision of normal first pregnancy in first trimester and Antibody E isoimmunization affecting pregnancy in third trimester, antepartum, fetus 1 on their problem list.  ----------------------------------------------------------------------------------- Patient reports no complaints.   Contractions: Irregular. Vag. Bleeding: None.  Movement: Present. Leaking Fluid denies.  ----------------------------------------------------------------------------------- The following portions of the patient's history were reviewed and updated as appropriate: allergies, current medications, past family history, past medical history, past social history, past surgical history and problem list. Problem list updated.  Objective  Blood pressure 120/80, weight 172 lb (78 kg), last menstrual period 10/10/2019. Pregravid weight 174 lb (78.9 kg) Total Weight Gain -2 lb (-0.907 kg) Urinalysis: Urine Protein    Urine Glucose    Fetal Status: Fetal Heart Rate (bpm): 143 Fundal Height: 34 cm Movement: Present     General:  Alert, oriented and cooperative. Patient is in no acute distress.  Skin: Skin is warm and dry. No rash noted.   Cardiovascular: Normal heart rate noted  Respiratory: Normal respiratory effort, no problems with respiration noted  Abdomen: Soft, gravid, appropriate for gestational age. Pain/Pressure: Absent     Pelvic:  Cervical exam deferred        Extremities: Normal range of motion.  Edema: None  Mental Status: Normal mood and affect. Normal behavior. Normal judgment and thought content.   Assessment   30 y.o. G1P0 at [redacted]w[redacted]d by  07/16/2020, by Last Menstrual Period presenting for routine prenatal visit  Plan   pregnancy 1 Problems (from 01/07/20 to present)     Problem Noted Resolved   Encounter for supervision of normal first pregnancy in first trimester 01/07/2020 by Zipporah Plants, CNM No   Overview Addendum 03/21/2020  8:12 AM by Zipporah Plants, CNM     Nursing Staff Provider  Office Location  [facility] Dating   lmp = 14 wk [place]  Language  English Anatomy US    Flu Vaccine  01/22/2019 Genetic Screen  NIPS:    TDaP vaccine    Hgb A1C or  GTT Third trimester :   Rhogam   not needed   LAB RESULTS   Feeding Plan  Blood Type A/Positive/-- (12/27 1526)   Contraception  Antibody Positive,    Circumcision  Rubella 2.26 (12/27 1526)  Pediatrician   RPR Non Reactive (12/27 1526)   Support Person  HBsAg Negative (12/27 1526)   Prenatal Classes  HIV Non Reactive (12/27 1526)    Varicella  Immune  BTL Consent  GBS  (For PCN allergy, check sensitivities)        VBAC Consent  Pap  NIL 2021    Hgb Electro   negative    CF  negative     SMA  negative    Fragile x  negative   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    Return in about 2 weeks (around 06/16/2020) for growth scan and rob.  Tresea Mall, CNM 06/02/2020 4:25 PM

## 2020-06-03 LAB — AB SCR+ANTIBODY ID
Antibody Screen: POSITIVE — AB
Coombs Titer #1: 4

## 2020-06-03 LAB — ANTIBODY SCREEN

## 2020-06-05 ENCOUNTER — Other Ambulatory Visit: Payer: Self-pay | Admitting: Obstetrics and Gynecology

## 2020-06-05 DIAGNOSIS — O360930 Maternal care for other rhesus isoimmunization, third trimester, not applicable or unspecified: Secondary | ICD-10-CM

## 2020-06-10 ENCOUNTER — Ambulatory Visit: Payer: Commercial Managed Care - PPO

## 2020-06-17 MED ORDER — MIDAZOLAM HCL 2 MG/2ML IJ SOLN
INTRAMUSCULAR | Status: AC
  Filled 2020-06-17: qty 2

## 2020-06-23 ENCOUNTER — Other Ambulatory Visit: Payer: Self-pay

## 2020-06-23 ENCOUNTER — Encounter: Payer: Self-pay | Admitting: Obstetrics and Gynecology

## 2020-06-23 ENCOUNTER — Ambulatory Visit (INDEPENDENT_AMBULATORY_CARE_PROVIDER_SITE_OTHER): Payer: Commercial Managed Care - PPO | Admitting: Obstetrics and Gynecology

## 2020-06-23 ENCOUNTER — Other Ambulatory Visit (HOSPITAL_COMMUNITY)
Admission: RE | Admit: 2020-06-23 | Discharge: 2020-06-23 | Disposition: A | Discharge status: Home / Self Care | Payer: Commercial Managed Care - PPO | Source: Ambulatory Visit | Attending: Obstetrics and Gynecology | Admitting: Obstetrics and Gynecology

## 2020-06-23 VITALS — BP 116/72 | Wt 170.0 lb

## 2020-06-23 DIAGNOSIS — Z3403 Encounter for supervision of normal first pregnancy, third trimester: Secondary | ICD-10-CM | POA: Diagnosis present

## 2020-06-23 DIAGNOSIS — O360911 Maternal care for other rhesus isoimmunization, first trimester, fetus 1: Secondary | ICD-10-CM

## 2020-06-23 DIAGNOSIS — Z3A36 36 weeks gestation of pregnancy: Secondary | ICD-10-CM

## 2020-06-23 NOTE — Patient Instructions (Signed)
[email or web address]">  Third Trimester of Pregnancy  The third trimester of pregnancy is from week 28 through week 40. This is months 7 through 9. The third trimester is a time when the unborn baby (fetus) is growing rapidly. At the end of the ninth month, the fetus is about 20inches long and weighs 6-10 pounds. Body changes during your third trimester During the third trimester, your body will continue to go through many changes.The changes vary and generally return to normal after your baby is born. Physical changes Your weight will continue to increase. You can expect to gain 25-35 pounds (11-16 kg) by the end of the pregnancy if you begin pregnancy at a normal weight. If you are underweight, you can expect to gain 28-40 lb (about 13-18 kg), and if you are overweight, you can expect to gain 15-25 lb (about 7-11 kg). You may begin to get stretch marks on your hips, abdomen, and breasts. Your breasts will continue to grow and may hurt. A yellow fluid (colostrum) may leak from your breasts. This is the first milk you are producing for your baby. You may have changes in your hair. These can include thickening of your hair, rapid growth, and changes in texture. Some people also have hair loss during or after pregnancy, or hair that feels dry or thin. Your belly button may stick out. You may notice more swelling in your hands, face, or ankles. Health changes You may have heartburn. You may have constipation. You may develop hemorrhoids. You may develop swollen, bulging veins (varicose veins) in your legs. You may have increased body aches in the pelvis, back, or thighs. This is due to weight gain and increased hormones that are relaxing your joints. You may have increased tingling or numbness in your hands, arms, and legs. The skin on your abdomen may also feel numb. You may feel short of breath because of your expanding uterus. Other  changes You may urinate more often because the fetus is moving lower into your pelvis and pressing on your bladder. You may have more problems sleeping. This may be caused by the size of your abdomen, an increased need to urinate, and an increase in your body's metabolism. You may notice the fetus "dropping," or moving lower in your abdomen (lightening). You may have increased vaginal discharge. You may notice that you have pain around your pelvic bone as your uterus distends. Follow these instructions at home: Medicines Follow your health care provider's instructions regarding medicine use. Specific medicines may be either safe or unsafe to take during pregnancy. Do not take any medicines unless approved by your health care provider. Take a prenatal vitamin that contains at least 600 micrograms (mcg) of folic acid. Eating and drinking Eat a healthy diet that includes fresh fruits and vegetables, whole grains, good sources of protein such as meat, eggs, or tofu, and low-fat dairy products. Avoid raw meat and unpasteurized juice, milk, and cheese. These carry germs that can harm you and your baby. Eat 4 or 5 small meals rather than 3 large meals a day. You may need to take these actions to prevent or treat constipation: Drink enough fluid to keep your urine pale yellow. Eat foods that are high in fiber, such as beans, whole grains, and fresh fruits and vegetables. Limit foods that are high in fat and processed sugars, such as fried or sweet foods. Activity Exercise only as directed by your health care provider. Most people can continue their usual exercise routine during pregnancy. Try  to exercise for 30 minutes at least 5 days a week. Stop exercising if you experience contractions in the uterus. Stop exercising if you develop pain or cramping in the lower abdomen or lower back. Avoid heavy lifting. Do not exercise if it is very hot or humid or if you are at a high altitude. If you choose to,  you may continue to have sex unless your health care provider tells you not to. Relieving pain and discomfort Take frequent breaks and rest with your legs raised (elevated) if you have leg cramps or low back pain. Take warm sitz baths to soothe any pain or discomfort caused by hemorrhoids. Use hemorrhoid cream if your health care provider approves. Wear a supportive bra to prevent discomfort from breast tenderness. If you develop varicose veins: Wear support hose as told by your health care provider. Elevate your feet for 15 minutes, 3-4 times a day. Limit salt in your diet. Safety Talk to your health care provider before traveling far distances. Do not use hot tubs, steam rooms, or saunas. Wear your seat belt at all times when driving or riding in a car. Talk with your health care provider if someone is verbally or physically abusive to you. Preparing for birth To prepare for the arrival of your baby: Take prenatal classes to understand, practice, and ask questions about labor and delivery. Visit the hospital and tour the maternity area. Purchase a rear-facing car seat and make sure you know how to install it in your car. Prepare the baby's room or sleeping area. Make sure to remove all pillows and stuffed animals from the baby's crib to prevent suffocation. General instructions Avoid cat litter boxes and soil used by cats. These carry germs that can cause birth defects in the baby. If you have a cat, ask someone to clean the litter box for you. Do not douche or use tampons. Do not use scented sanitary pads. Do not use any products that contain nicotine or tobacco, such as cigarettes, e-cigarettes, and chewing tobacco. If you need help quitting, ask your health care provider. Do not use any herbal remedies, illegal drugs, or medicines that were not prescribed to you. Chemicals in these products can harm your baby. Do not drink alcohol. You will have more frequent prenatal exams during the  third trimester. During a routine prenatal visit, your health care provider will do a physical exam, perform tests, and discuss your overall health. Keep all follow-up visits. This is important. Where to find more information American Pregnancy Association: americanpregnancy.[place] and Gynecologists: PoolDevices.com.pt Office on [employer]: KeywordPortfolios.com.br Contact a health care provider if you have: A fever. Mild pelvic cramps, pelvic pressure, or nagging pain in your abdominal area or lower back. Vomiting or diarrhea. Bad-smelling vaginal discharge or foul-smelling urine. Pain when you urinate. A headache that does not go away when you take medicine. Visual changes or see spots in front of your eyes. Get help right away if: Your water breaks. You have regular contractions less than 5 minutes apart. You have spotting or bleeding from your vagina. You have severe abdominal pain. You have difficulty breathing. You have chest pain. You have fainting spells. You have not felt your baby move for the time period told by your health care provider. You have new or increased pain, swelling, or redness in an arm or leg. Summary The third trimester of pregnancy is from week 28 through week 40 (months 7 through 9). You may have more problems  sleeping. This can be caused by the size of your abdomen, an increased need to urinate, and an increase in your body's metabolism. You will have more frequent prenatal exams during the third trimester. Keep all follow-up visits. This is important. This information is not intended to replace advice given to you by your health care provider. Make sure you discuss any questions you have with your healthcare provider. Document Revised: 06/06/2019 Document Reviewed: 04/12/2019 Elsevier Patient Education  2022 [employer] Vaginal Delivery  Vaginal delivery means that you give birth by pushing your  baby out of your birth canal (vagina). Your health care team will help you before, during, and after vaginaldelivery. Birth experiences are unique for every woman and every pregnancy, and birthexperiences vary depending on where you choose to give birth. What are the risks and benefits? Generally, this is safe. However, problems may occur, including: Bleeding. Infection. Damage to other structures such as vaginal tearing. Allergic reactions to medicines. Despite the risks, benefits of vaginal delivery include less risk of bleeding and infection and a shorter recovery time compared to a Cesarean delivery.Cesarean delivery, or C-section, is the surgical delivery of a baby. What happens when I arrive at the birth center or hospital? Once you are in labor and have been admitted into the hospital or birth center, your health care team may: Review your pregnancy history and any concerns that you have. Talk with you about your birth plan and discuss pain control options. Check your blood pressure, breathing, and heartbeat. Assess your baby's heartbeat. Monitor your uterus for contractions. Check whether your bag of water (amniotic sac) has broken (ruptured). Insert an IV into one of your veins. This may be used to give you fluids and medicines. Monitoring Your health care team may assess your contractions (uterine monitoring) and your baby's heart rate (fetal monitoring). You may need to be monitored: Often, but not continuously (intermittently). All the time or for long periods at a time (continuously). Continuous monitoring may be needed if: You are taking certain medicines, such as medicine to relieve pain or make your contractions stronger. You have pregnancy or labor complications. Monitoring may be done by: Placing a special stethoscope or a handheld monitoring device on your abdomen to check your baby's heartbeat and to check for contractions. Placing monitors on your abdomen (external  monitors) to record your baby's heartbeat and the frequency and length of contractions. Placing monitors inside your uterus through your vagina (internal monitors) to record your baby's heartbeat and the frequency, length, and strength of your contractions. Depending on the type of monitor, it may remain in your uterus or on your baby's head until birth. Telemetry. This is a type of continuous monitoring that can be done with external or internal monitors. Instead of having to stay in bed, you are able to move around. Physical exam Your health care team may perform frequent physical exams. This may include: Checking how and where your baby is positioned in your uterus. Checking your cervix to determine: Whether it is thinning out (effacing). Whether it is opening up (dilating). What happens during labor and delivery?  Normal labor and delivery is divided into the following three stages: Stage 1 This is the longest stage of labor. Throughout this stage, you will feel contractions. Contractions generally feel mild, infrequent, and irregular at first. They get stronger, more frequent, and more regular as you move through this stage. You may have contractions about every 2-3 minutes. This stage ends when your cervix is completely  dilated to 4 inches (10 cm) and completely effaced. Stage 2 This stage starts once your cervix is completely effaced and dilated and lasts until the delivery of your baby. This is the stage where you will feel an urge to push your baby out of your vagina. You may feel stretching and burning pain, especially when the widest part of your baby's head passes through the vaginal opening (crowning). Once your baby is delivered, the umbilical cord will be clamped and cut. Timing of cutting the cord will depend on your wishes, your baby's health, and your health care provider's practices. Your baby will be placed on your bare chest (skin-to-skin contact) in an upright position and  covered with a warm blanket. If you are choosing to breastfeed, watch your baby for feeding cues, like rooting or sucking, and help the baby to your breast for his or her first feeding. Stage 3 This stage starts immediately after the birth of your baby and ends after you deliver the placenta. This stage may take anywhere from 5 to 30 minutes. After your baby has been delivered, you will feel contractions as your body expels the placenta. These contractions also help your uterus get smaller and reduce bleeding. What can I expect after labor and delivery? After labor is over, you and your baby will be assessed closely until you are ready to go home. Your health care team will teach you how to care for yourself and your baby. You and your baby may be encouraged to stay in the same room (rooming in) during your hospital stay. This will help promote early bonding and successful breastfeeding. Your uterus will be checked and massaged regularly (fundal massage). You may continue to receive fluids and medicines through an IV. You will have some soreness and pain in your abdomen, vagina, and the area of skin between your vaginal opening and your anus (perineum). If an incision was made near your vagina (episiotomy) or if you had some vaginal tearing during delivery, cold compresses may be placed on your episiotomy or your tear. This helps to reduce pain and swelling. It is normal to have vaginal bleeding after delivery. Wear a sanitary pad for vaginal bleeding and discharge. Summary Vaginal delivery means that you will give birth by pushing your baby out of your birth canal (vagina). Your health care team will monitor you and your baby throughout the stages of labor. After you deliver your baby, your health care team will continue to assess you and your baby to ensure you are both recovering as expected after delivery. This information is not intended to replace advice given to you by your health care  provider. Make sure you discuss any questions you have with your healthcare provider. Document Revised: 11/26/2019 Document Reviewed: 11/26/2019 Elsevier Patient Education  2022 [employer] Pain Relief During Labor and Delivery Many things can cause pain during labor and delivery, including: Pressure due to the baby moving through the pelvis. Stretching of tissues due to the baby moving through the birth canal. Muscle tension due to anxiety or nervousness. The uterus tightening (contracting)and relaxing to help move the baby. How do I get pain relief during labor and delivery?  Discuss your pain relief options with your health care provider during your prenatal visits. Explore the options offered by your hospital or birth center. There are many ways to deal with the pain of labor and delivery. You can try relaxation techniques or doing relaxing activities, taking a warm shower or bath (hydrotherapy), or  other methods. There are also many medicines available to help controlpain. Relaxation techniques and activities Practice relaxation techniques or do relaxing activities, such as: Focused breathing. Meditation. Visualization. Aroma therapy. Listening to your favorite music. Hypnosis. Hydrotherapy Take a warm shower or bath. This may: Provide comfort and relaxation. Lessen your feeling of pain. Reduce the amount of pain medicine needed. Shorten the length of labor. Other methods Try doing other things, such as: Getting a massage or having counterpressure on your back. Applying warm packs or ice packs. Changing positions often, moving around, or using a birthing ball. Medicines You may be given: Pain medicine through an IV or an injection into a muscle. Pain medicine inserted into your spinal column. Injections of sterile water just under the skin on your lower back. Nitrous oxide inhalation therapy, also called laughing gas. What kinds of medicine are available for pain  relief? There are two kinds of medicines that can be used to relieve pain during labor and delivery: Analgesics. These medicines decrease pain without causing you to lose feeling or the ability to move your muscles. Anesthetics. These medicines block feeling in the body and can decrease your ability to move freely. Both kinds of medicine can cause minor side effects, such as nausea, trouble concentrating, and sleepiness. They can also affect the baby's heart rate before birth and his or her breathing after birth. For this reason, health careproviders are careful about when and how much medicine is given. Which medicines are used to provide pain relief? Common medicines The most common medicines used to help manage pain during labor and delivery include: Opioids. Opioids are medicines that decrease how much pain you feel (perception of pain). These medicines can be given through an IV or may be used with anesthetics to block pain. Epidural analgesia. Epidural analgesia is given through a very thin tube that is inserted into the lower back. Medicine is delivered continuously to the area near your spinal column nerves (epidural space). After having this treatment, you may be able to move your legs, but you will not be able to walk. Depending on the amount and type of medicine given, you may lose all feeling in the lower half of your body, or you may have some sensation, including the urge to push. This treatment can be used to give pain relief for a vaginal birth. Sometimes, a numbing medicine is injected into the spinal fluid when an epidural catheter is placed. This provides for immediate relief but only lasts for 1-2 hours. Once it wears off, the epidural will provide pain relief. This is called a combined spinal-epidural (CSE) block. Intrathecal analgesia (spinal analgesia). Intrathecal analgesia is similar to epidural analgesia, but the medicine is injected into the spinal fluid instead of the epidural  space. It is usually only given once. It starts to relieve pain quickly, but the pain relief lasts only 1-2 hours. Pudendal block. This block is done by injecting numbing medicine through the wall of the vagina and into a nerve in the pelvis. Other medicines Other medicines used to help manage pain during labor and delivery include: Local anesthetics. These are used to numb a small area of the body. They may be used along with another kind of medicine or used to numb the nerves of the vagina, cervix, and perineum during the second stage of labor. Spinal block (spinal anesthesia). Spinal anesthesia is similar to spinal analgesia, but the medicine that is used contains longer-acting numbing medicines and pain medicines. This type of anesthesia  can be used for a cesarean delivery and allows you to stay awake for the birth of your baby. General anesthetics cause you to lose consciousness so you do not feel pain. They are usually only used for an emergency cesarean delivery. These medicines are given through an IV or a mask or both. These medicines are used as part of a procedure or for an emergency delivery. Summary Women have many options to help them manage the pain associated with labor and delivery. You can try doing relaxing activities, taking a warm shower or bath, or other methods. There are also many medicines available to help control pain during labor and delivery. Talk with your health care provider about what options are available to you. This information is not intended to replace advice given to you by your health care provider. Make sure you discuss any questions you have with your healthcare provider. Document Revised: 11/15/2018 Document Reviewed: 11/15/2018 Elsevier Patient Education  2022 [employer] Inc. Augmentation of Labor Augmentation of labor is when steps are taken to stimulate and strengthen contractions of the uterus during labor. This may be done when contractions have slowed  or stopped, and the progress of labor and delivery of the baby isdelayed. Before augmentation of labor begins, these factors will be considered: The mother's medical condition and the baby's condition. The size and position of the baby. The size of the birth canal. Tell a health care provider about: Any allergies you have. All medicines you are taking, including vitamins, herbs, eye drops, creams, and over-the-counter medicines. Any problems you or your family members have had with anesthetic medicines. Any surgeries you have had. Any blood disorders you have. Any medical conditions you have. What are the risks? Generally, this is a safe procedure. However, problems may occur, including: Tearing (rupture) of the uterus. Breaking off (abruption) of the placenta. Increased risk of infection for you and your baby. Increased risk of cesarean, forceps, or vacuum delivery. Excessive bleeding after delivery (postpartum hemorrhage). Umbilical cord prolapse. This can cause the umbilical cord to get squeezed during birth. Too much stimulation of the contractions. This can result in continuous, prolonged, or very strong contractions. Your baby could fail to get enough blood flow or oxygen. This can be life-threatening (fetal death). What happens during the procedure? Augmentation of labor may include: Giving you medicine that stimulates contractions (oxytocin). This is given through an IV that is inserted into a vein in your arm. Breaking the fluid-filled sac that surrounds the fetus (amniotic sac). This procedure is called artificial rupture of membranes. This procedure may vary among health care providers and hospitals. Summary Augmentation of labor is when steps are taken to stimulate and strengthen contractions of the uterus during labor. This may be done when contractions have slowed down or stopped, and the progress of labor and delivery of the baby is delayed. Labor augmentation may be done  using medicine to stimulate contractions (oxytocin). Or it can be done by breaking the fluid-filled sac that surrounds the fetus (amniotic sac). Generally, this is a safe procedure. However, problems can occur. Talk with your health care provider about the potential risks and benefits of labor augmentation if this is offered to you. This information is not intended to replace advice given to you by your health care provider. Make sure you discuss any questions you have with your healthcare provider. Document Revised: 10/11/2019 Document Reviewed: 10/11/2019 Elsevier Patient Education  2022 [employer].

## 2020-06-23 NOTE — Progress Notes (Signed)
Routine Prenatal Care Visit  Subjective  Rebecca Mccann is a 30 y.o. G1P0 at [redacted]w[redacted]d being seen today for ongoing prenatal care.  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  ----------------------------------------------------------------------------------- Patient reports no complaints.   Contractions: Irregular. Vag. Bleeding: None.  Movement: Present. Denies leaking of fluid.  ----------------------------------------------------------------------------------- The following portions of the patient's history were reviewed and updated as appropriate: allergies, current medications, past family history, past medical history, past social history, past surgical history and problem list. Problem list updated.   Objective  Last menstrual period 10/10/2019. Pregravid weight 174 lb (78.9 kg) Total Weight Gain -4 lb (-1.814 kg) Urinalysis:      Fetal Status: Fetal Heart Rate (bpm): 160 Fundal Height: 36 cm Movement: Present  Presentation: Vertex  General:  Alert, oriented and cooperative. Patient is in no acute distress.  Skin: Skin is warm and dry. No rash noted.   Cardiovascular: Normal heart rate noted  Respiratory: Normal respiratory effort, no problems with respiration noted  Abdomen: Soft, gravid, appropriate for gestational age. Pain/Pressure: Absent     Pelvic:  Cervical exam performed Dilation: 3 Effacement (%): 90 Station: -3  Extremities: Normal range of motion.  Edema: None  Mental Status: Normal mood and affect. Normal behavior. Normal judgment and thought content.     Assessment   30 y.o. G1P0 at [redacted]w[redacted]d by  07/16/2020, by Last Menstrual Period presenting for routine prenatal visit  Plan   pregnancy 1 Problems (from 01/07/20 to present)      Problem Noted Resolved   Encounter for supervision of normal first pregnancy in third trimester 01/07/2020 by Zipporah Plants, CNM No   Overview Addendum 03/21/2020  8:12 AM by Zipporah Plants, CNM     Nursing Staff Provider  Office Location  [facility] Dating   lmp = 14 wk [place]  Language  English Anatomy US    Flu Vaccine  01/22/2019 Genetic Screen  NIPS:    TDaP vaccine    Hgb A1C or  GTT Third trimester :   Rhogam   not needed   LAB RESULTS   Feeding Plan  Blood Type A/Positive/-- (12/27 1526)   Contraception  Antibody Positive,    Circumcision  Rubella 2.26 (12/27 1526)  Pediatrician   RPR Non Reactive (12/27 1526)   Support Person  HBsAg Negative (12/27 1526)   Prenatal Classes  HIV Non Reactive (12/27 1526)    Varicella  Immune  BTL Consent  GBS  (For PCN allergy, check sensitivities)        VBAC Consent  Pap  NIL 2021    Hgb Electro   negative    CF  negative     SMA  negative    Fragile x  negative  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 -   Continue monitoring anti-E antibody titer levels every month  Have her current partner screened to determine if he carries the  E antigen (if possible)  Monthly ultrasounds for fetal assessment  Start monitoring the peak systolic velocity of the middle cerebral  arteries should the anti-E antibody titer levels reach a critical  titer of 1:16 or greater            She missed her growth [place] appointment- [name] will check with MFM regarding availability Lab is closed since  it is after 4:30 and they declines to draw labs today. Will need antibody titer next week.  GBS/ GC/CT today.   Gestational age appropriate obstetric precautions including but not limited to vaginal bleeding, contractions, leaking of fluid and fetal movement were reviewed in detail with the patient.    No follow-ups on file.  Natale Milch MD [facility], [facility] 06/23/2020, 5:00 PM

## 2020-06-24 LAB — CERVICOVAGINAL ANCILLARY ONLY
Chlamydia: NEGATIVE
Comment: NEGATIVE
Comment: NORMAL
Neisseria Gonorrhea: NEGATIVE

## 2020-06-28 LAB — CULTURE, BETA STREP (GROUP B ONLY): Strep Gp B Culture: NEGATIVE

## 2020-07-01 ENCOUNTER — Observation Stay (HOSPITAL_BASED_OUTPATIENT_CLINIC_OR_DEPARTMENT_OTHER)
Admission: EM | Admit: 2020-07-01 | Discharge: 2020-07-01 | Disposition: A | Discharge status: Home / Self Care | Payer: Commercial Managed Care - PPO | Source: Home / Self Care | Admitting: Advanced Practice Midwife

## 2020-07-01 ENCOUNTER — Other Ambulatory Visit: Payer: Self-pay

## 2020-07-01 ENCOUNTER — Encounter: Payer: Self-pay | Admitting: Obstetrics and Gynecology

## 2020-07-01 ENCOUNTER — Ambulatory Visit (INDEPENDENT_AMBULATORY_CARE_PROVIDER_SITE_OTHER): Payer: Commercial Managed Care - PPO | Admitting: Obstetrics

## 2020-07-01 VITALS — BP 112/64 | Wt 170.0 lb

## 2020-07-01 DIAGNOSIS — Z3403 Encounter for supervision of normal first pregnancy, third trimester: Secondary | ICD-10-CM

## 2020-07-01 DIAGNOSIS — Z3A37 37 weeks gestation of pregnancy: Secondary | ICD-10-CM

## 2020-07-01 DIAGNOSIS — O4693 Antepartum hemorrhage, unspecified, third trimester: Secondary | ICD-10-CM | POA: Insufficient documentation

## 2020-07-01 LAB — POCT URINALYSIS DIPSTICK OB
Glucose, UA: NEGATIVE
POC,PROTEIN,UA: NEGATIVE

## 2020-07-01 NOTE — Progress Notes (Signed)
Routine Prenatal Care Visit  Subjective  Rebecca Mccann is a 30 y.o. G1P0 at [redacted]w[redacted]d being seen today for ongoing prenatal care.  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  ----------------------------------------------She was seeno Labor and [facility] earlier today for contractions and sent home. She denies any headache or visual changes. Seeing some bloody show today. Contractions: Irregular. Vag. Bleeding: Scant.  Movement: Present. Leaking Fluid denies.  ----------------------------------------------------------------------------------- The following portions of the patient's history were reviewed and updated as appropriate: allergies, current medications, past family history, past medical history, past social history, past surgical history and problem list. Problem list updated.  Objective  Blood pressure 112/64, weight 170 lb (77.1 kg), last menstrual period 10/10/2019. Pregravid weight 174 lb (78.9 kg) Total Weight Gain -4 lb (-1.814 kg) Urinalysis: Urine Protein    Urine Glucose    Fetal Status:     Movement: Present     General:  Alert, oriented and cooperative. Patient is in no acute distress.  Skin: Skin is warm and dry. No rash noted.   Cardiovascular: Normal heart rate noted  Respiratory: Normal respiratory effort, no problems with respiration noted  Abdomen: Soft, gravid, appropriate for gestational age. Pain/Pressure: Absent     Pelvic:  Cervical exam performed      3 cm/80%/-3  Extremities: Normal range of motion.     Mental Status: Normal mood and affect. Normal behavior. Normal judgment and thought content.   Assessment    30 y.o. G1P0 at [redacted]w[redacted]d by  07/16/2020, by Last Menstrual Period presenting for routine prenatal visit  Plan   pregnancy 1 Problems (from 01/07/20 to present)    Problem Noted Resolved   [redacted] weeks gestation of pregnancy 07/01/2020 by Tresea Mall, CNM No   Vaginal bleeding in pregnancy, third trimester 07/01/2020 by Tresea Mall, CNM No   Encounter for supervision of normal first pregnancy in third trimester 01/07/2020 by Zipporah Plants, CNM No   Overview Addendum 07/01/2020  3:32 PM by Mirna Mires, CNM     Nursing Staff Provider  Office Location  [facility] Dating   lmp = 14 wk [place]  Language  English Anatomy US    Flu Vaccine  01/22/2019 Genetic Screen  NIPS:    TDaP vaccine    Hgb A1C or  GTT Third trimester :   Rhogam   not needed   LAB RESULTS   Feeding Plan  Blood Type A/Positive/-- (12/27 1526)   Contraception  Antibody Positive,    Circumcision  Rubella 2.26 (12/27 1526)  Pediatrician   RPR Non Reactive (12/27 1526)   Support Person  HBsAg Negative (12/27 1526)   Prenatal Classes  HIV Non Reactive (12/27 1526)    Varicella  Immune  BTL Consent  GBS  (For PCN allergy, check sensitivities) negative       VBAC Consent  Pap  NIL 2021    Hgb Electro   negative    CF  negative     SMA  negative    Fragile x  negative   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 -   Continue monitoring anti-E antibody titer levels every month  Have her current partner screened to determine if he carries the  E  antigen (if possible)  Monthly ultrasounds for fetal assessment  Start monitoring the peak systolic velocity of the middle cerebral  arteries should the anti-E antibody titer levels reach a critical  titer of 1:16 or greater           Term labor symptoms and general obstetric precautions including but not limited to vaginal bleeding, contractions, leaking of fluid and fetal movement were reviewed in detail with the patient. Please refer to After Visit  Summary for other counseling recommendations.   Return in about 1 week (around 07/08/2020) for return OB.  Mirna Mires, CNM  07/01/2020 3:33 PM

## 2020-07-01 NOTE — Addendum Note (Signed)
Addended by: Kathlene Cote on: 07/01/2020 04:04 PM   Modules accepted: Orders

## 2020-07-01 NOTE — Progress Notes (Signed)
Rebecca Mccann was discharged home after CNM assessing pt. Pt ambulated and left with her sister. VS stable. Education given about leaking of fluid, fetal movements, vaginal bleeding, and contractions. Pt verbalized understanding and no other concerns at this time.

## 2020-07-01 NOTE — OB Triage Note (Signed)
Pt is a G1P0 stated that she went to the bathroom, felt a "pop" and a "gush" of blood around 4am. No pain at this time, no N/V/D. Pt states feeling baby move.

## 2020-07-01 NOTE — Discharge Summary (Addendum)
Physician Final Progress Note  Patient ID: Rebecca Mccann MRN: [id number] DOB/AGE: 10-Aug-1990 30 y.o.  Admit date: 07/01/2020 Admitting provider: Tresea Mall, CNM Discharge date: 07/01/2020   Admission Diagnoses: bleeding in third trimester  Discharge Diagnoses:  Active Problems:   Encounter for supervision of normal first pregnancy in third trimester   Labor and delivery, indication for care   [redacted] weeks gestation of pregnancy   Vaginal bleeding in pregnancy, third trimester    History of Present Illness: The patient is a 30 y.o. female G1P0 at [redacted]w[redacted]d who presents for a gush of bright red bleeding earlier this morning. She had intercourse the night before and when she got up to use the bathroom she felt a "pop" and then had the bleeding. Since then she has only brown spotting with some mucous with wiping. She reports good fetal movement. She does feel some braxton hicks contractions. She denies leakage of fluid.  She was admitted for observation and placed on monitors. All is reassuring and she is discharged to home with instructions and precautions.  History reviewed. No pertinent past medical history.  History reviewed. No pertinent surgical history.  No current facility-administered medications on file prior to encounter.   Current Outpatient Medications on File Prior to Encounter  Medication Sig Dispense Refill   Prenatal Vit-Fe Fumarate-FA (MULTIVITAMIN-PRENATAL) 27-0.8 MG TABS tablet Take 1 tablet by mouth daily at 12 noon.     Doxylamine-Pyridoxine (DICLEGIS) 10-10 MG TBEC Take 2 tablets by mouth at bedtime. If symptoms persist, add one tablet in the morning and one in the afternoon (Patient not taking: No sig reported) 100 tablet 5   promethazine (PHENERGAN) 25 MG tablet Take 1 tablet (25 mg total) by mouth every 6 (six) hours as needed for nausea or vomiting. (Patient not taking: No sig reported) 120 tablet 3    No Known Allergies  Social History   Socioeconomic  History   Marital status: Not on file    Spouse name: Not on file   Number of children: Not on file   Years of education: Not on file   Highest education level: Not on file  Occupational History   Occupation: Comptroller  Tobacco Use   Smoking status: Never   Smokeless tobacco: Never  Vaping Use   Vaping Use: Never used  Substance and Sexual Activity   Alcohol use: Not Currently   Drug use: Never   Sexual activity: Yes    Birth control/protection: None  Other Topics Concern   Not on file  Social History Narrative   Not on file   Social Determinants of Health   Financial Resource Strain: Not on file  Food Insecurity: Not on file  Transportation Needs: Not on file  Physical Activity: Not on file  Stress: Not on file  Social Connections: Not on file  Intimate Partner Violence: Not on file    Family History  Problem Relation Age of Onset   Hypertension Mother    Cancer Maternal Aunt      Review of Systems  Constitutional:  Negative for chills and fever.  HENT:  Negative for congestion, ear discharge, ear pain, hearing loss, sinus pain and sore throat.   Eyes:  Negative for blurred vision and double vision.  Respiratory:  Negative for cough, shortness of breath and wheezing.   Cardiovascular:  Negative for chest pain, palpitations and leg swelling.  Gastrointestinal:  Negative for abdominal pain, blood in stool, constipation, diarrhea, heartburn, melena, nausea and vomiting.  Genitourinary:  Negative  for dysuria, flank pain, frequency, hematuria and urgency.       Positive for vaginal bleeding  Musculoskeletal:  Negative for back pain, joint pain and myalgias.  Skin:  Negative for itching and rash.  Neurological:  Negative for dizziness, tingling, tremors, sensory change, speech change, focal weakness, seizures, loss of consciousness, weakness and headaches.  Endo/Heme/Allergies:  Negative for environmental allergies. Does not bruise/bleed easily.   Psychiatric/Behavioral:  Negative for depression, hallucinations, memory loss, substance abuse and suicidal ideas. The patient is not nervous/anxious and does not have insomnia.     Physical Exam: BP 123/87 (BP Location: Left Arm)   Pulse 70   Temp 98.2 F (36.8 C) (Oral)   Resp 16   Ht 5\' 4"  (1.626 m)   Wt 77.1 kg   LMP 10/10/2019 (Exact Date)   BMI 29.18 kg/m   Constitutional: Well nourished, well developed female in no acute distress.  HEENT: normal Skin: Warm and dry.  Cardiovascular: Regular rate and rhythm.   Extremity:  no edema   Respiratory: Clear to auscultation bilateral. Normal respiratory effort Abdomen: FHT present Back: no CVAT Neuro: DTRs 2+, Cranial nerves grossly intact Psych: Alert and Oriented x3. No memory deficits. Normal mood and affect.  MS: normal gait, normal bilateral lower extremity ROM/strength/stability.  Pelvic exam:  No active bleeding, cervical exam deferred  NST: Reactive 2 hour tracing, 135 bpm, moderate variability, +accelerations, -decelerations Toco: irregular, mild to palpation   Consults: None  Significant Findings/ Diagnostic Studies: none  Procedures: NST  Hospital Course: The patient was admitted to Labor and Delivery Triage for observation.   Discharge Condition: good  Disposition: Discharge disposition: 01-Home or Self Care  Diet: Regular diet  Discharge Activity: Activity as tolerated  Discharge Instructions     Discharge activity:  No Restrictions   Complete by: As directed    Discharge diet:  No restrictions   Complete by: As directed    LABOR:  When conractions begin, you should start to time them from the beginning of one contraction to the beginning  of the next.  When contractions are 5 - 10 minutes apart or less and have been regular for at least an hour, you should call your health care provider.   Complete by: As directed    No sexual activity restrictions   Complete by: As directed    Notify physician  for bleeding from the vagina   Complete by: As directed    Notify physician for blurring of vision or spots before the eyes   Complete by: As directed    Notify physician for chills or fever   Complete by: As directed    Notify physician for fainting spells, "black outs" or loss of consciousness   Complete by: As directed    Notify physician for increase in vaginal discharge   Complete by: As directed    Notify physician for leaking of fluid   Complete by: As directed    Notify physician for pain or burning when urinating   Complete by: As directed    Notify physician for pelvic pressure (sudden increase)   Complete by: As directed    Notify physician for severe or continued nausea or vomiting   Complete by: As directed    Notify physician for sudden gushing of fluid from the vagina (with or without continued leaking)   Complete by: As directed    Notify physician for sudden, constant, or occasional abdominal pain   Complete by: As directed  Notify physician if baby moving less than usual   Complete by: As directed       Allergies as of 07/01/2020   No Known Allergies      Medication List     STOP taking these medications    Doxylamine-Pyridoxine 10-10 MG Tbec Commonly known as: Diclegis   promethazine 25 MG tablet Commonly known as: PHENERGAN       TAKE these medications    multivitamin-prenatal 27-0.8 MG Tabs tablet Take 1 tablet by mouth daily at 12 noon.        Follow-up Information     [facility] Go to.   Specialty: Obstetrics and Gynecology Why: scheduled prenatal appointment Contact information: [address] [place] [address] [phone]                Total time spent taking care of this patient: 17 minutes  Signed: Tresea Mall, CNM  07/01/2020, 8:05 AM

## 2020-07-01 NOTE — Progress Notes (Signed)
Mucus w/Dark red blood. Went to L&D for observation this am.

## 2020-07-04 ENCOUNTER — Inpatient Hospital Stay
Admission: EM | Admit: 2020-07-04 | Discharge: 2020-07-06 | DRG: 806 | Disposition: A | Discharge status: Home / Self Care | Payer: Commercial Managed Care - PPO | Attending: Obstetrics and Gynecology | Admitting: Obstetrics and Gynecology

## 2020-07-04 ENCOUNTER — Inpatient Hospital Stay: Payer: Commercial Managed Care - PPO | Admitting: Certified Registered"

## 2020-07-04 ENCOUNTER — Encounter: Payer: Self-pay | Admitting: Obstetrics and Gynecology

## 2020-07-04 ENCOUNTER — Other Ambulatory Visit: Payer: Self-pay

## 2020-07-04 DIAGNOSIS — Z3A38 38 weeks gestation of pregnancy: Secondary | ICD-10-CM | POA: Diagnosis not present

## 2020-07-04 DIAGNOSIS — Z880 Allergy status to penicillin: Secondary | ICD-10-CM | POA: Diagnosis not present

## 2020-07-04 DIAGNOSIS — O1414 Severe pre-eclampsia complicating childbirth: Secondary | ICD-10-CM | POA: Diagnosis present

## 2020-07-04 DIAGNOSIS — O1413 Severe pre-eclampsia, third trimester: Secondary | ICD-10-CM | POA: Diagnosis present

## 2020-07-04 DIAGNOSIS — O360930 Maternal care for other rhesus isoimmunization, third trimester, not applicable or unspecified: Secondary | ICD-10-CM | POA: Diagnosis present

## 2020-07-04 DIAGNOSIS — Z3403 Encounter for supervision of normal first pregnancy, third trimester: Secondary | ICD-10-CM

## 2020-07-04 DIAGNOSIS — O4693 Antepartum hemorrhage, unspecified, third trimester: Secondary | ICD-10-CM

## 2020-07-04 DIAGNOSIS — Z20822 Contact with and (suspected) exposure to covid-19: Secondary | ICD-10-CM | POA: Diagnosis present

## 2020-07-04 DIAGNOSIS — O368130 Decreased fetal movements, third trimester, not applicable or unspecified: Secondary | ICD-10-CM | POA: Diagnosis present

## 2020-07-04 DIAGNOSIS — Z3A37 37 weeks gestation of pregnancy: Secondary | ICD-10-CM

## 2020-07-04 HISTORY — DX: Other specified health status: Z78.9

## 2020-07-04 LAB — RUPTURE OF MEMBRANE (ROM)PLUS: Rom Plus: POSITIVE

## 2020-07-04 LAB — COMPREHENSIVE METABOLIC PANEL
ALT: 52 U/L — ABNORMAL HIGH (ref 0–44)
ALT: 54 U/L — ABNORMAL HIGH (ref 0–44)
ALT: 56 U/L — ABNORMAL HIGH (ref 0–44)
AST: 44 U/L — ABNORMAL HIGH (ref 15–41)
AST: 42 U/L — ABNORMAL HIGH (ref 15–41)
AST: 44 U/L — ABNORMAL HIGH (ref 15–41)
Albumin: 2.9 g/dL — ABNORMAL LOW (ref 3.5–5.0)
Albumin: 2.8 g/dL — ABNORMAL LOW (ref 3.5–5.0)
Albumin: 2.8 g/dL — ABNORMAL LOW (ref 3.5–5.0)
Alkaline Phosphatase: 62 U/L (ref 38–126)
Alkaline Phosphatase: 64 U/L (ref 38–126)
Alkaline Phosphatase: 67 U/L (ref 38–126)
Anion gap: 4 — ABNORMAL LOW (ref 5–15)
Anion gap: 5 (ref 5–15)
Anion gap: 6 (ref 5–15)
BUN: <5 mg/dL — ABNORMAL LOW (ref 6–20)
BUN: <5 mg/dL — ABNORMAL LOW (ref 6–20)
BUN: <5 mg/dL — ABNORMAL LOW (ref 6–20)
CO2: 22 mmol/L (ref 22–32)
CO2: 23 mmol/L (ref 22–32)
CO2: 21 mmol/L — ABNORMAL LOW (ref 22–32)
Calcium: 8.6 mg/dL — ABNORMAL LOW (ref 8.9–10.3)
Calcium: 8.3 mg/dL — ABNORMAL LOW (ref 8.9–10.3)
Calcium: 8 mg/dL — ABNORMAL LOW (ref 8.9–10.3)
Chloride: 110 mmol/L (ref 98–111)
Chloride: 107 mmol/L (ref 98–111)
Chloride: 108 mmol/L (ref 98–111)
Creatinine, Ser: 0.58 mg/dL (ref 0.44–1.00)
Creatinine, Ser: 0.63 mg/dL (ref 0.44–1.00)
Creatinine, Ser: 0.56 mg/dL (ref 0.44–1.00)
GFR, Estimated: >60 mL/min (ref >60)
GFR, Estimated: >60 mL/min (ref >60)
GFR, Estimated: >60 mL/min (ref >60)
Glucose, Bld: 78 mg/dL (ref 70–99)
Glucose, Bld: 80 mg/dL (ref 70–99)
Glucose, Bld: 133 mg/dL — ABNORMAL HIGH (ref 70–99)
Potassium: 3.5 mmol/L (ref 3.5–5.1)
Potassium: 3.4 mmol/L — ABNORMAL LOW (ref 3.5–5.1)
Potassium: 3.4 mmol/L — ABNORMAL LOW (ref 3.5–5.1)
Sodium: 136 mmol/L (ref 135–145)
Sodium: 135 mmol/L (ref 135–145)
Sodium: 135 mmol/L (ref 135–145)
Total Bilirubin: 0.4 mg/dL (ref 0.3–1.2)
Total Bilirubin: 0.6 mg/dL (ref 0.3–1.2)
Total Bilirubin: 0.5 mg/dL (ref 0.3–1.2)
Total Protein: 6.2 g/dL — ABNORMAL LOW (ref 6.5–8.1)
Total Protein: 5.9 g/dL — ABNORMAL LOW (ref 6.5–8.1)
Total Protein: 6.2 g/dL — ABNORMAL LOW (ref 6.5–8.1)

## 2020-07-04 LAB — CBC
HCT: 31.6 % — ABNORMAL LOW (ref 36.0–46.0)
HCT: 33.2 % — ABNORMAL LOW (ref 36.0–46.0)
HCT: 34.3 % — ABNORMAL LOW (ref 36.0–46.0)
Hemoglobin: 11.1 g/dL — ABNORMAL LOW (ref 12.0–15.0)
Hemoglobin: 11.4 g/dL — ABNORMAL LOW (ref 12.0–15.0)
Hemoglobin: 11.7 g/dL — ABNORMAL LOW (ref 12.0–15.0)
MCH: 30.5 pg (ref 26.0–34.0)
MCH: 30 pg (ref 26.0–34.0)
MCH: 29.7 pg (ref 26.0–34.0)
MCHC: 35.1 g/dL (ref 30.0–36.0)
MCHC: 34.3 g/dL (ref 30.0–36.0)
MCHC: 34.1 g/dL (ref 30.0–36.0)
MCV: 86.8 fL (ref 80.0–100.0)
MCV: 87.4 fL (ref 80.0–100.0)
MCV: 87.1 fL (ref 80.0–100.0)
Platelets: 145 10*3/uL — ABNORMAL LOW (ref 150–400)
Platelets: 129 10*3/uL — ABNORMAL LOW (ref 150–400)
Platelets: 146 10*3/uL — ABNORMAL LOW (ref 150–400)
RBC: 3.64 MIL/uL — ABNORMAL LOW (ref 3.87–5.11)
RBC: 3.8 MIL/uL — ABNORMAL LOW (ref 3.87–5.11)
RBC: 3.94 MIL/uL (ref 3.87–5.11)
RDW: 13.6 % (ref 11.5–15.5)
RDW: 13.4 % (ref 11.5–15.5)
RDW: 13.5 % (ref 11.5–15.5)
WBC: 6.4 10*3/uL (ref 4.0–10.5)
WBC: 4.8 10*3/uL (ref 4.0–10.5)
WBC: 11.2 10*3/uL — ABNORMAL HIGH (ref 4.0–10.5)
nRBC: 0 % (ref 0.0–0.2)
nRBC: 0 % (ref 0.0–0.2)
nRBC: 0 % (ref 0.0–0.2)

## 2020-07-04 LAB — RESP PANEL BY RT-PCR (FLU A&B, COVID) ARPGX2
Influenza A by PCR: NEGATIVE
Influenza B by PCR: NEGATIVE
SARS Coronavirus 2 by RT PCR: NEGATIVE

## 2020-07-04 LAB — PROTEIN / CREATININE RATIO, URINE
Creatinine, Urine: 104 mg/dL
Protein Creatinine Ratio: 0.32 mg/mg{Cre} — ABNORMAL HIGH (ref 0.00–0.15)
Total Protein, Urine: 33 mg/dL

## 2020-07-04 LAB — ABO/RH: ABO/RH(D): A POS

## 2020-07-04 LAB — RPR: RPR Ser Ql: NONREACTIVE

## 2020-07-04 MED ORDER — OXYTOCIN-SODIUM CHLORIDE 30-0.9 UT/500ML-% IV SOLN
1.0000 m[IU]/min | INTRAVENOUS | Status: DC
Start: 2020-07-04 — End: 2020-07-04
  Administered 2020-07-04: 2 m[IU]/min via INTRAVENOUS

## 2020-07-04 MED ORDER — FERROUS SULFATE 325 (65 FE) MG PO TABS
325.0000 mg | ORAL_TABLET | Freq: Two times a day (BID) | ORAL | Status: DC
Start: 2020-07-05 — End: 2020-07-06
  Administered 2020-07-05 – 2020-07-06 (×3): 325 mg via ORAL
  Filled 2020-07-04 (×3): qty 1

## 2020-07-04 MED ORDER — DIPHENHYDRAMINE HCL 25 MG PO CAPS: 25.0000 mg | ORAL_CAPSULE | Freq: Four times a day (QID) | ORAL | Status: DC | PRN

## 2020-07-04 MED ORDER — OXYTOCIN-SODIUM CHLORIDE 30-0.9 UT/500ML-% IV SOLN
INTRAVENOUS | Status: AC
  Filled 2020-07-04: qty 500

## 2020-07-04 MED ORDER — DIBUCAINE (PERIANAL) 1 % EX OINT
1.0000 | TOPICAL_OINTMENT | CUTANEOUS | Status: DC | PRN
Start: 2020-07-04 — End: 2020-07-06
  Administered 2020-07-06: 1 via RECTAL
  Filled 2020-07-04: qty 28

## 2020-07-04 MED ORDER — OXYTOCIN-SODIUM CHLORIDE 30-0.9 UT/500ML-% IV SOLN
2.5000 [IU]/h | INTRAVENOUS | Status: DC
  Filled 2020-07-04: qty 500

## 2020-07-04 MED ORDER — SOD CITRATE-CITRIC ACID 500-334 MG/5ML PO SOLN: 30.0000 mL | ORAL | Status: DC | PRN

## 2020-07-04 MED ORDER — LABETALOL HCL 5 MG/ML IV SOLN: 20.0000 mg | INTRAVENOUS | Status: DC | PRN

## 2020-07-04 MED ORDER — LABETALOL HCL 5 MG/ML IV SOLN: 40.0000 mg | INTRAVENOUS | Status: DC | PRN

## 2020-07-04 MED ORDER — COCONUT OIL OIL
1.0000 "application " | TOPICAL_OIL | Status: DC | PRN
  Administered 2020-07-06: 1 via TOPICAL
  Filled 2020-07-04 (×2): qty 120

## 2020-07-04 MED ORDER — HYDROCODONE-ACETAMINOPHEN 5-325 MG PO TABS: 1.0000 | ORAL_TABLET | Freq: Three times a day (TID) | ORAL | Status: DC | PRN

## 2020-07-04 MED ORDER — ONDANSETRON HCL 4 MG/2ML IJ SOLN: 4.0000 mg | INTRAMUSCULAR | Status: DC | PRN

## 2020-07-04 MED ORDER — CALCIUM GLUCONATE 10 % IV SOLN
INTRAVENOUS | Status: AC
  Filled 2020-07-04: qty 10

## 2020-07-04 MED ORDER — WITCH HAZEL-GLYCERIN EX PADS
1.0000 | MEDICATED_PAD | CUTANEOUS | Status: DC | PRN
Start: 2020-07-04 — End: 2020-07-06
  Administered 2020-07-06: 1 via TOPICAL
  Filled 2020-07-04: qty 100

## 2020-07-04 MED ORDER — MISOPROSTOL 200 MCG PO TABS
ORAL_TABLET | ORAL | Status: AC
  Filled 2020-07-04: qty 4

## 2020-07-04 MED ORDER — LACTATED RINGERS IV SOLN: INTRAVENOUS | Status: DC

## 2020-07-04 MED ORDER — BENZOCAINE-MENTHOL 20-0.5 % EX AERO
1.0000 | INHALATION_SPRAY | CUTANEOUS | Status: DC | PRN
Start: 2020-07-04 — End: 2020-07-06
  Administered 2020-07-06: 1 via TOPICAL
  Filled 2020-07-04: qty 56

## 2020-07-04 MED ORDER — HYDRALAZINE HCL 20 MG/ML IJ SOLN: 10.0000 mg | INTRAMUSCULAR | Status: DC | PRN

## 2020-07-04 MED ORDER — FENTANYL 2.5 MCG/ML W/ROPIVACAINE 0.15% IN NS 100 ML EPIDURAL (ARMC)
EPIDURAL | Status: DC | PRN
  Administered 2020-07-04: 12 mL/h via EPIDURAL

## 2020-07-04 MED ORDER — ACETAMINOPHEN 325 MG PO TABS: 650.0000 mg | ORAL_TABLET | ORAL | Status: DC | PRN

## 2020-07-04 MED ORDER — FENTANYL 2.5 MCG/ML W/ROPIVACAINE 0.15% IN NS 100 ML EPIDURAL (ARMC)
EPIDURAL | Status: AC
  Filled 2020-07-04: qty 100

## 2020-07-04 MED ORDER — BUTORPHANOL TARTRATE 1 MG/ML IJ SOLN: 1.0000 mg | INTRAMUSCULAR | Status: DC | PRN

## 2020-07-04 MED ORDER — SENNOSIDES-DOCUSATE SODIUM 8.6-50 MG PO TABS
2.0000 | ORAL_TABLET | ORAL | Status: DC
  Administered 2020-07-04: 2 via ORAL
  Filled 2020-07-04 (×2): qty 2

## 2020-07-04 MED ORDER — LIDOCAINE-EPINEPHRINE (PF) 1.5 %-1:200000 IJ SOLN
INTRAMUSCULAR | Status: DC | PRN
  Administered 2020-07-04: 3 mL via PERINEURAL

## 2020-07-04 MED ORDER — ACETAMINOPHEN 325 MG PO TABS
650.0000 mg | ORAL_TABLET | ORAL | Status: DC | PRN
  Administered 2020-07-06: 650 mg via ORAL
  Filled 2020-07-04 (×2): qty 2

## 2020-07-04 MED ORDER — LACTATED RINGERS IV SOLN: 500.0000 mL | INTRAVENOUS | Status: DC | PRN

## 2020-07-04 MED ORDER — IBUPROFEN 600 MG PO TABS
600.0000 mg | ORAL_TABLET | Freq: Four times a day (QID) | ORAL | Status: DC
  Administered 2020-07-04 – 2020-07-06 (×4): 600 mg via ORAL
  Filled 2020-07-04 (×6): qty 1

## 2020-07-04 MED ORDER — ONDANSETRON HCL 4 MG/2ML IJ SOLN
4.0000 mg | Freq: Four times a day (QID) | INTRAMUSCULAR | Status: DC | PRN
  Administered 2020-07-04: 4 mg via INTRAVENOUS
  Filled 2020-07-04: qty 2

## 2020-07-04 MED ORDER — MAGNESIUM SULFATE BOLUS VIA INFUSION
4.0000 g | Freq: Once | INTRAVENOUS | Status: AC
  Administered 2020-07-04: 4 g via INTRAVENOUS
  Filled 2020-07-04: qty 1000

## 2020-07-04 MED ORDER — PRENATAL MULTIVITAMIN CH
1.0000 | ORAL_TABLET | Freq: Every day | ORAL | Status: DC
  Administered 2020-07-05 – 2020-07-06 (×2): 1 via ORAL
  Filled 2020-07-04 (×2): qty 1

## 2020-07-04 MED ORDER — BUPIVACAINE HCL (PF) 0.25 % IJ SOLN
INTRAMUSCULAR | Status: DC | PRN
  Administered 2020-07-04: 2 mL via EPIDURAL
  Administered 2020-07-04: 3 mL via EPIDURAL

## 2020-07-04 MED ORDER — SIMETHICONE 80 MG PO CHEW: 80.0000 mg | CHEWABLE_TABLET | ORAL | Status: DC | PRN

## 2020-07-04 MED ORDER — ONDANSETRON HCL 4 MG PO TABS
4.0000 mg | ORAL_TABLET | ORAL | Status: DC | PRN
  Filled 2020-07-04: qty 1

## 2020-07-04 MED ORDER — LABETALOL HCL 5 MG/ML IV SOLN: 80.0000 mg | INTRAVENOUS | Status: DC | PRN

## 2020-07-04 MED ORDER — LACTATED RINGERS AMNIOINFUSION
INTRAVENOUS | Status: DC
  Filled 2020-07-04 (×3): qty 1000

## 2020-07-04 MED ORDER — MAGNESIUM SULFATE 40 GM/1000ML IV SOLN
2.0000 g/h | INTRAVENOUS | Status: DC
  Administered 2020-07-04 – 2020-07-05 (×2): 2 g/h via INTRAVENOUS
  Filled 2020-07-04 (×2): qty 1000

## 2020-07-04 MED ORDER — OXYTOCIN BOLUS FROM INFUSION
333.0000 mL | Freq: Once | INTRAVENOUS | Status: AC
  Administered 2020-07-04: 333 mL via INTRAVENOUS

## 2020-07-04 MED ORDER — TERBUTALINE SULFATE 1 MG/ML IJ SOLN: 0.2500 mg | Freq: Once | INTRAMUSCULAR | Status: DC | PRN

## 2020-07-04 MED ORDER — LIDOCAINE HCL (PF) 1 % IJ SOLN: 30.0000 mL | INTRAMUSCULAR | Status: DC | PRN

## 2020-07-04 MED ORDER — LIDOCAINE HCL (PF) 1 % IJ SOLN
INTRAMUSCULAR | Status: DC | PRN
  Administered 2020-07-04: 3 mL

## 2020-07-04 NOTE — Progress Notes (Signed)
RN to notify Rebecca Mccann, CNM of pt newest labs. Liver enzymes remain elevated, with a drop in platelets and WBC. Verbal order to start Magnesium sulfate. Will continue to monitor.

## 2020-07-04 NOTE — Anesthesia Procedure Notes (Signed)
Epidural Patient location during procedure: OB Start time: 07/04/2020 11:19 AM  Staffing Resident/CRNA: Jaye Beagle, CRNA Performed: resident/CRNA   Preanesthetic Checklist Completed: patient identified, IV checked, site marked, risks and benefits discussed, surgical consent, monitors and equipment checked, pre-op evaluation and timeout performed  Epidural Patient position: sitting Prep: ChloraPrep Patient monitoring: heart rate and blood pressure Approach: midline Injection technique: LOR saline  Needle:  Needle type: Tuohy  Needle gauge: 18 G Needle length: 9 cm Needle insertion depth: 6.5 cm Catheter type: closed end flexible Catheter size: 20 Guage Catheter at skin depth: 12 cm Test dose: negative and 1.5% lidocaine with Epi 1:200 K  Assessment Events: blood not aspirated, injection not painful, no injection resistance, no paresthesia and negative IV test  Additional Notes Pt tolerated well.  Attempt x1.

## 2020-07-04 NOTE — OB Triage Note (Signed)
Patient arrived in triage with c/o "gush" of clear fluid around 10 pm last night. Reports leaking since. Good fetal movement noted. Some mild contractions, about 20 minutes apart noted. Patient denies vaginal bleeding.

## 2020-07-04 NOTE — Progress Notes (Signed)
RN to notify Higinio Roger, CNM of patient's concerning labs. Upon RN shift assessment, patient had elevated blood pressures cycling q24mins. Pt's CMP shows elevated liver Enzymes. CBC shows platelets that are trended lower than her last two draws. Pt P/C ratio was elevated also. RN voices concerns that patient might be heading towards early hellp syndrome. [place], CNM states "pt doesn't quite meet criteria for hellp syndrome and we will redraw labs in 6 hours. RN to put in the redraw orders. Will continue to monitor.

## 2020-07-04 NOTE — H&P (Signed)
Obstetric H&P   Chief Complaint: Contractions  Prenatal Care Provider: [facility]  History of Present Illness: 30 y.o. G1P0 [redacted]w[redacted]d by 07/16/2020, by Last Menstrual Period presenting to L&D reporting leakage of fluid and decreased fetal movement. No contractions, no vaginal bleeding.    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  Pregravid weight 78.9 kg Total Weight Gain -1.815 kg  pregnancy 1 Problems (from 01/07/20 to present)     Problem Noted Resolved   [redacted] weeks gestation of pregnancy 07/01/2020 by Tresea Mall, CNM No   Vaginal bleeding in pregnancy, third trimester 07/01/2020 by Tresea Mall, CNM No   Encounter for supervision of normal first pregnancy in third trimester 01/07/2020 by Zipporah Plants, CNM No   Overview Addendum 07/01/2020  3:32 PM by Mirna Mires, CNM     Nursing Staff Provider  Office Location  [facility] Dating   lmp = 14 wk [place]  Language  English Anatomy US    Flu Vaccine  01/22/2019 Genetic Screen  NIPS:    TDaP vaccine    Hgb A1C or  GTT Third trimester :   Rhogam   not needed   LAB RESULTS   Feeding Plan  Blood Type A/Positive/-- (12/27 1526)   Contraception  Antibody Positive,    Circumcision  Rubella 2.26 (12/27 1526)  Pediatrician   RPR Non Reactive (12/27 1526)   Support Person  HBsAg Negative (12/27 1526)   Prenatal Classes  HIV Non Reactive (12/27 1526)    Varicella  Immune  BTL Consent  GBS  (For PCN allergy, check sensitivities) negative       VBAC Consent  Pap  NIL 2021    Hgb Electro   negative    CF  negative     SMA  negative    Fragile x  negative  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 -   Continue monitoring anti-E antibody titer levels every month  Have her current partner screened to determine if he carries the  E antigen (if possible)  Monthly ultrasounds for fetal assessment  Start monitoring the peak systolic velocity of the middle cerebral  arteries  should the anti-E antibody titer levels reach a critical  titer of 1:16 or greater            Review of Systems: 10 point review of systems negative unless otherwise noted in HPI  Past Medical History: Patient Active Problem List   Diagnosis Date Noted   Labor and delivery indication for care or intervention 07/04/2020   Normal labor 07/04/2020   Labor and delivery, indication for care 07/01/2020   [redacted] weeks gestation of pregnancy 07/01/2020   Vaginal bleeding in pregnancy, third trimester 07/01/2020   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, antepartum, fetus 1 01/15/2020   Encounter for supervision of normal first pregnancy in third trimester 01/07/2020     Nursing Staff Provider  Office Location  [facility] Dating   lmp = 14 wk [place]  Language  English Anatomy US    Flu Vaccine  01/22/2019 Genetic Screen  NIPS:    TDaP vaccine    Hgb A1C or  GTT Third trimester :   Rhogam   not needed   LAB RESULTS   Feeding Plan  Blood Type A/Positive/-- (12/27 1526)   Contraception  Antibody Positive,    Circumcision  Rubella 2.26 (12/27 1526)  Pediatrician   RPR Non Reactive (12/27 1526)   Support Person  HBsAg Negative (  12/27 1526)   Prenatal Classes  HIV Non Reactive (12/27 1526)    Varicella  Immune  BTL Consent  GBS  (For PCN allergy, check sensitivities) negative       VBAC Consent  Pap  NIL 2021    Hgb Electro   negative    CF  negative     SMA  negative    Fragile x  negative  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  Past Obstetric History: # 1 - Date: None, Sex: None, Weight: None, GA: None, Delivery: None, Apgar1: None, Apgar5: None, Living: None, Birth Comments: None   Past Gynecologic History:  Family History: Family History  Problem Relation Age of Onset   Hypertension Mother    Cancer Maternal Aunt     Social History: Social History   Socioeconomic History   Marital status: Single    Spouse name: Not on file   Number of children: Not on file   Years of education: Not on file   Highest education level: Not on file  Occupational History   Occupation: Comptroller  Tobacco Use   Smoking status: Never   Smokeless tobacco: Never  Vaping Use   Vaping Use: Never used  Substance and Sexual Activity   Alcohol use: Not Currently   Drug use: Never   Sexual activity: Yes    Birth control/protection: None  Other Topics Concern   Not on file  Social History Narrative   Not on file   Social Determinants of Health   Financial Resource Strain: Not on file  Food Insecurity: Not on file  Transportation Needs: Not on file  Physical Activity: Not on file  Stress: Not on file  Social Connections: Not on file  Intimate Partner Violence: Not on file    Medications: Prior to Admission medications   Medication Sig Start Date End Date Taking? Authorizing Provider  Prenatal Vit-Fe Fumarate-FA (MULTIVITAMIN-PRENATAL) 27-0.8 MG TABS tablet Take 1 tablet by mouth daily at 12 noon.   Yes [provider]    Allergies: No Known Allergies  Physical Exam: Vitals: Blood pressure 119/81, pulse 65, temperature 98.7 F (37.1 C), temperature source Oral, resp. rate 18, height 5\' 4"  (1.626 m), weight 77.1 kg, last menstrual period 10/10/2019.   FHT: 125, moderate, +accels, no decels Toco: rare  General: NAD HEENT: normocephalic, anicteric Pulmonary: No increased work of breathing Cardiovascular: RRR, distal pulses 2+ Abdomen: Gravid,  non-tender Leopolds:vtx Genitourinary: Dilation: 3 Effacement (%): 80 Station: -3  Extremities: no edema, erythema, or tenderness Neurologic: Grossly intact Psychiatric: mood appropriate, affect full  Labs: Results for orders placed or performed during the hospital encounter of 07/04/20 (from the past 24 hour(s))  ROM Plus ([facility] only)     Status: None   Collection Time: 07/04/20  1:41 AM  Result Value Ref Range   Rom Plus POSITIVE     Assessment: 30 y.o. G1P0 [redacted]w[redacted]d by 07/16/2020, by Last Menstrual Period presenting in term labor  Plan: 1) SROM - monitor for contractions pattern and cervical change, pitocin augmentation if does  not increase contraction pattern  2) Fetus - cat I tracing  3) PNL - Blood type A/Positive/-- (12/27 1526) / Anti-bodyscreen Positive, See Final Results (05/23 1623) / Rubella 2.26 (12/27 1526) / Varicella Immune / RPR Non Reactive (04/08 1204) / HBsAg Negative (12/27 1526) / HIV Non Reactive (04/08 1204) /  GBS Negative/-- (06/13 1545)  4) Immunization History -  Immunization History  Administered Date(s) Administered   Influenza,inj,Quad PF,6+ Mos 01/22/2020    5) Disposition - anticipate vaginal delivery  Vena Austria, MD, [facility], [facility] 07/04/2020, 2:31 AM

## 2020-07-04 NOTE — Progress Notes (Signed)
  Labor Progress Note   30 y.o. G1P0 @ [redacted]w[redacted]d , admitted for  Pregnancy, Labor Management. SROM 10 PM 6/23  Subjective:  Comfortable with epidural. Zofran helping nausea. She was chilled- turned heat up and put on a warm blanket. She is now warm.   Objective:  BP (!) 128/91   Pulse (!) 58   Temp (!) 97.2 F (36.2 C) (Axillary)   Resp 20   Ht 5\' 4"  (1.626 m)   Wt 77.1 kg   LMP 10/10/2019 (Exact Date)   SpO2 100%   BMI 29.18 kg/m  Abd: gravid, ND, FHT present, mild tenderness on exam Extr: no edema SVE: CERVIX: 5 cm dilated, 90 effaced, 0 station IUPC and FSE placed  EFM: FHR: 110 bpm, variability: moderate,  accelerations:  Present,  decelerations:  Present early Toco: Frequency: Every 2-4 minutes Labs: I have reviewed the patient's lab results. Results for ANALYSE, ANGST (MRN Jeannetta Nap) as of 07/04/2020 15:23  Ref. Range 07/04/2020 13:52  Sodium Latest Ref Range: 135 - 145 mmol/L 135  Potassium Latest Ref Range: 3.5 - 5.1 mmol/L 3.4 (L)  Chloride Latest Ref Range: 98 - 111 mmol/L 107  CO2 Latest Ref Range: 22 - 32 mmol/L 23  Glucose Latest Ref Range: 70 - 99 mg/dL 80  BUN Latest Ref Range: 6 - 20 mg/dL <5 (L)  Creatinine Latest Ref Range: 0.44 - 1.00 mg/dL 07/06/2020  Calcium Latest Ref Range: 8.9 - 10.3 mg/dL 8.3 (L)  Anion gap Latest Ref Range: 5 - 15  5  Alkaline Phosphatase Latest Ref Range: 38 - 126 U/L 64  Albumin Latest Ref Range: 3.5 - 5.0 g/dL 2.8 (L)  AST Latest Ref Range: 15 - 41 U/L 42 (H)  ALT Latest Ref Range: 0 - 44 U/L 54 (H)  Total Protein Latest Ref Range: 6.5 - 8.1 g/dL 5.9 (L)  Total Bilirubin Latest Ref Range: 0.3 - 1.2 mg/dL 0.6  GFR, Estimated Latest Ref Range: >60 mL/min >60  WBC Latest Ref Range: 4.0 - 10.5 K/uL 4.8  RBC Latest Ref Range: 3.87 - 5.11 MIL/uL 3.80 (L)  Hemoglobin Latest Ref Range: 12.0 - 15.0 g/dL 9.16 (L)  HCT Latest Ref Range: 36.0 - 46.0 % 33.2 (L)  MCV Latest Ref Range: 80.0 - 100.0 fL 87.4  MCH Latest Ref Range: 26.0 - 34.0 pg  30.0  MCHC Latest Ref Range: 30.0 - 36.0 g/dL 38.4  RDW Latest Ref Range: 11.5 - 15.5 % 13.4  Platelets Latest Ref Range: 150 - 400 K/uL 129 (L)  nRBC Latest Ref Range: 0.0 - 0.2 % 0.0     Assessment & Plan:  G1P0 @ [redacted]w[redacted]d, admitted for  Pregnancy and Labor/Delivery Management  1. Pain management: epidural. 2. FWB: FHT category II.  3. ID: GBS negative 4. Labor management: Pitocin quartered, amnioinfusion  All discussed with patient, see orders   [redacted]w[redacted]d, CNM [facility] 07/04/2020  3:20 PM

## 2020-07-04 NOTE — Anesthesia Preprocedure Evaluation (Signed)
Anesthesia Evaluation  Patient identified by MRN, date of birth, ID band Patient awake    History of Anesthesia Complications Negative for: history of anesthetic complications  Airway Mallampati: II  TM Distance: >3 FB Neck ROM: Full    Dental  (+) Teeth Intact   Pulmonary neg pulmonary ROS,           Cardiovascular Exercise Tolerance: Good negative cardio ROS   Rate:Normal     Neuro/Psych negative neurological ROS  negative psych ROS   GI/Hepatic Neg liver ROS, GERD  ,  Endo/Other  negative endocrine ROS  Renal/GU negative Renal ROS  negative genitourinary   Musculoskeletal negative musculoskeletal ROS (+)   Abdominal   Peds  Hematology  (+) anemia ,   Anesthesia Other Findings   Reproductive/Obstetrics (+) Pregnancy                             Anesthesia Physical Anesthesia Plan  ASA: 2  Anesthesia Plan: Epidural   Post-op Pain Management:    Induction:   PONV Risk Score and Plan:   Airway Management Planned:   Additional Equipment:   Intra-op Plan:   Post-operative Plan:   Informed Consent: I have reviewed the patients History and Physical, chart, labs and discussed the procedure including the risks, benefits and alternatives for the proposed anesthesia with the patient or authorized representative who has indicated his/her understanding and acceptance.       Plan Discussed with: Anesthesiologist  Anesthesia Plan Comments:         Anesthesia Quick Evaluation

## 2020-07-04 NOTE — Progress Notes (Addendum)
Labor Progress Note   30 y.o. G1P0 @ [redacted]w[redacted]d , admitted for  Pregnancy, Labor Management. SROM at 10 PM 6/23  Subjective:  She is coping well with increasingly painful contractions. We discussed plan related to elevated BP and lab results that could be trending towards HELLP syndrome. She denies headache, visual changes, epigastric pain.  Objective:  BP (!) 131/93   Pulse 65   Temp 98.2 F (36.8 C) (Oral)   Resp 18   Ht 5\' 4"  (1.626 m)   Wt 77.1 kg   LMP 10/10/2019 (Exact Date)   SpO2 100%   BMI 29.18 kg/m  Abd: gravid, ND, FHT present, mild tenderness on exam Extr: no edema SVE: CERVIX: 4 cm dilated, 90 effaced, -1 station  EFM: FHR: 120 bpm, variability: moderate,  accelerations:  Present,  decelerations:  Present early decelerations noted Toco: Frequency: Every 2 minutes  Labs: Current lab results:  Recent Labs    07/04/20 0234  WBC 6.4  HGB 11.1*  HCT 31.6*   Results for SEAIRRA, OTANI (MRN Jeannetta Nap) as of 07/04/2020 10:37  Ref. Range 07/04/2020 01:41 07/04/2020 02:34 07/04/2020 03:56 07/04/2020 07:38 07/04/2020 07:58  COMPREHENSIVE METABOLIC PANEL Unknown    Rpt (A)   Sodium Latest Ref Range: 135 - 145 mmol/L    136   Potassium Latest Ref Range: 3.5 - 5.1 mmol/L    3.5   Chloride Latest Ref Range: 98 - 111 mmol/L    110   CO2 Latest Ref Range: 22 - 32 mmol/L    22   Glucose Latest Ref Range: 70 - 99 mg/dL    78   BUN Latest Ref Range: 6 - 20 mg/dL    <5 (L)   Creatinine Latest Ref Range: 0.44 - 1.00 mg/dL    07/06/2020   Calcium Latest Ref Range: 8.9 - 10.3 mg/dL    8.6 (L)   Anion gap Latest Ref Range: 5 - 15     4 (L)   Alkaline Phosphatase Latest Ref Range: 38 - 126 U/L    62   Albumin Latest Ref Range: 3.5 - 5.0 g/dL    2.9 (L)   AST Latest Ref Range: 15 - 41 U/L    44 (H)   ALT Latest Ref Range: 0 - 44 U/L    52 (H)   Total Protein Latest Ref Range: 6.5 - 8.1 g/dL    6.2 (L)   Total Bilirubin Latest Ref Range: 0.3 - 1.2 mg/dL    0.4   GFR, Estimated Latest Ref  Range: >60 mL/min    >60   WBC Latest Ref Range: 4.0 - 10.5 K/uL  6.4     RBC Latest Ref Range: 3.87 - 5.11 MIL/uL  3.64 (L)     Hemoglobin Latest Ref Range: 12.0 - 15.0 g/dL  4.81 (L)     HCT Latest Ref Range: 36.0 - 46.0 %  31.6 (L)     MCV Latest Ref Range: 80.0 - 100.0 fL  86.8     MCH Latest Ref Range: 26.0 - 34.0 pg  30.5     MCHC Latest Ref Range: 30.0 - 36.0 g/dL  85.6     RDW Latest Ref Range: 11.5 - 15.5 %  13.6     Platelets Latest Ref Range: 150 - 400 K/uL  145 (L)     nRBC Latest Ref Range: 0.0 - 0.2 %  0.0     Sample Expiration Unknown  07/07/2020,2359     Antibody Identification  Unknown  ANTI E     Antibody Titer (2) Unknown  PENDING     PT AG Type Unknown  POSITIVE FOR c AN...     Antibody Screen Unknown  POS     ABO/RH(D) Unknown  A POS A POS...    RESP PANEL BY RT-PCR (FLU A&B, COVID) ARPGX2 Unknown  Rpt     Influenza A By PCR Latest Ref Range: NEGATIVE   NEGATIVE     Influenza B By PCR Latest Ref Range: NEGATIVE   NEGATIVE     SARS Coronavirus 2 by RT PCR Latest Ref Range: NEGATIVE   NEGATIVE     Total Protein, Urine Latest Units: mg/dL     33  Protein Creatinine Ratio Latest Ref Range: 0.00 - 0.15 mg/mgCre     0.32 (H)  Creatinine, Urine Latest Units: mg/dL     706  Rom Plus Unknown POSITIVE         Assessment & Plan:  G1P0 @ [redacted]w[redacted]d, admitted for  Pregnancy and Labor/Delivery Management  1. Pain management:  position changes . 2. FWB: FHT category II/reassuring.  3. ID: GBS negative 4. Labor management: expectant for vaginal delivery 5. Elevated BP/elevated LFTs/decreased platelets: recheck LFTs/platelets at 1:30 PM, start mag for worsening labs or severe range BPs, IV Labetalol as needed for severe range BPs 6. Epidural soon if desired  All discussed with patient, see orders  Tresea Mall, CNM [facility] 07/04/2020  10:26 AM

## 2020-07-04 NOTE — Progress Notes (Signed)
  Labor Progress Note   30 y.o. G1P0 @ [redacted]w[redacted]d , admitted for  Pregnancy, Labor Management. SROM at 10 PM 6/23  Subjective:  Comfortable with epidural, nauseous.   Objective:  BP (!) 128/91   Pulse 60   Temp 98.3 F (36.8 C) (Oral)   Resp 18   Ht 5\' 4"  (1.626 m)   Wt 77.1 kg   LMP 10/10/2019 (Exact Date)   SpO2 100%   BMI 29.18 kg/m  Abd: gravid, ND, FHT present, mild tenderness on exam Extr: no edema SVE: CERVIX: 5 cm dilated, 90 effaced, 0 station, forebag AROM  EFM: FHR: 120 bpm, variability: moderate,  accelerations:  Present,  decelerations:  Present variables- early and late noted, good return to baseline Toco: Frequency: Every 2-5 minutes Labs: I have reviewed the patient's lab results. CBC/CMP pending   Assessment & Plan:  G1P0 @ [redacted]w[redacted]d, admitted for  Pregnancy and Labor/Delivery Management  1. Pain management: epidural. 2. FWB: FHT category II.  3. ID: GBS negative 4. Labor management: halve pitocin, position changes 5. Elevated BP/elevated liver enzymes/low platelets: start mag for worsening  All discussed with patient, see orders   [redacted]w[redacted]d, CNM [facility] 07/04/2020  2:20 PM

## 2020-07-04 NOTE — Discharge Summary (Signed)
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 yo G1P0 at 31w2das admitted in Latent Labor on 07/04/2020. She was diagnosed with preeclampsia with severe features due to elevated blood pressures and abnormal HELLP labs, though she didn't quite meet criteria by the time of delivery.  Patient had an uncomplicated labor course as follows:  Membrane Rupture Time/Date: 10:00 PM ,07/03/2020   Delivery Method:Vaginal, Spontaneous  Episiotomy: None  Lacerations:  1st degree;Vaginal  Patient had an uncomplicated postpartum course.  She was maintained on magnesium for 24 hours postpartum.  She denies any s/s related to preeclampsia at time of discharge and will return for a 1 week BP check in clinic. Precautions reviewed with patient prior to discharge. She is ambulating, tolerating a regular diet, passing flatus, and urinating well. Patient is discharged home in stable condition on 07/06/20.  Newborn Data: Birth date:07/04/2020  Birth  time:6:38 PM  Gender:Female [place] Living status:Living  Apgars:9 ,9  Weight:2760 g   Magnesium Sulfate received: Yes: Seizure prophylaxis BMZ received: No Rhophylac:N/A MMR:No T-DaP: offer postpartum Flu: No Transfusion:No  Physical exam  Vitals:   07/06/20 0016 07/06/20 0458 07/06/20 0806 07/06/20 1212  BP: 123/83 127/87 122/83 118/78  Pulse: 70 73 65 87  Resp: '18 18 18 18  ' Temp: 98.7 F (37.1 C) 98.5 F (36.9 C) 98.4 F (36.9 C) 98.8 F (37.1 C)  TempSrc: Oral Oral Oral Oral  SpO2: 99% 99% 100% 99%  Weight:      Height:       General: alert, cooperative, and no distress Lochia: appropriate Uterine Fundus: firm Incision: N/A DVT Evaluation: No evidence of DVT seen on physical exam. Labs: Lab Results  Component Value Date   WBC 11.0 (H) 07/05/2020   HGB 11.7 (L) 07/05/2020   HCT 33.8 (L) 07/05/2020   MCV 86.9 07/05/2020   PLT 155 07/05/2020   CMP Latest Ref Rng & Units 07/04/2020  Glucose 70 - 99 mg/dL 133(H)  BUN 6 - 20 mg/dL <5(L)  Creatinine 0.44 - 1.00 mg/dL 0.56  Sodium 135 - 145 mmol/L 135  Potassium 3.5 - 5.1 mmol/L 3.4(L)  Chloride 98 - 111 mmol/L 108  CO2 22 - 32 mmol/L 21(L)  Calcium 8.9 - 10.3 mg/dL 8.0(L)  Total Protein 6.5 - 8.1 g/dL 6.2(L)  Total Bilirubin 0.3 - 1.2 mg/dL 0.5  Alkaline Phos 38 - 126 U/L 67  AST 15 - 41 U/L 44(H)  ALT 0 - 44 U/L 56(H)   Flavia Shipper Score: Edinburgh Postnatal Depression Scale Screening Tool 07/06/2020  I have been able to laugh and see the funny side of things. (No Data)      After visit meds:  Allergies as of 07/06/2020   No Known Allergies      Medication List     TAKE these medications    multivitamin-prenatal 27-0.8 MG Tabs tablet Take 1 tablet by mouth daily at 12 noon.         Discharge home in stable condition Infant Feeding: Breast Infant Disposition:home with mother Discharge instruction: per After Visit Summary and Postpartum booklet. Activity: Advance as tolerated. Pelvic  rest for 6 weeks.  Diet: routine diet Anticipated Birth Control: Unsure Postpartum Appointment:1 week Additional Postpartum F/U: BP check 1 week Future Appointments: Future Appointments  Date Time Provider [place]  07/09/2020  2:50 PM Schuman, Stefanie Libel, MD [facility] None   Follow up Visit:  Follow-up Information     Will Bonnet, MD. Schedule an appointment as soon as possible for a visit in 1 week(s).   Specialty: Obstetrics and Gynecology Why: Call and make a follow-up appointment for a visit in 1-week with Dr. Glennon Mac at [place] for a BP check! Contact information: [address] [place] [address] [phone]                   SIGNED: Christean Leaf, CNM [facility] 07/06/2020, 12:33 PM

## 2020-07-04 NOTE — Progress Notes (Signed)
Pt temp remains 96.5-97.2 axillary despite increasing room temperature to 80 degrees and applying multiple warm blankets. RN placed bear hugger on patient and will recheck temp shortly.

## 2020-07-05 LAB — CBC
HCT: 33.8 % — ABNORMAL LOW (ref 36.0–46.0)
Hemoglobin: 11.7 g/dL — ABNORMAL LOW (ref 12.0–15.0)
MCH: 30.1 pg (ref 26.0–34.0)
MCHC: 34.6 g/dL (ref 30.0–36.0)
MCV: 86.9 fL (ref 80.0–100.0)
Platelets: 155 10*3/uL (ref 150–400)
RBC: 3.89 MIL/uL (ref 3.87–5.11)
RDW: 13.7 % (ref 11.5–15.5)
WBC: 11 10*3/uL — ABNORMAL HIGH (ref 4.0–10.5)
nRBC: 0 % (ref 0.0–0.2)

## 2020-07-05 NOTE — Anesthesia Postprocedure Evaluation (Signed)
Anesthesia Post Note  Patient: Rebecca Mccann  Procedure(s) Performed: AN AD HOC LABOR EPIDURAL  Patient location during evaluation: Mother Baby Anesthesia Type: Epidural Level of consciousness: awake and alert Pain management: pain level controlled Vital Signs Assessment: post-procedure vital signs reviewed and stable Respiratory status: spontaneous breathing, nonlabored ventilation and respiratory function stable Cardiovascular status: stable Postop Assessment: no headache, no backache and epidural receding Anesthetic complications: no   No notable events documented.   Last Vitals:  Vitals:   07/05/20 0730 07/05/20 0925  BP:  (!) 141/98  Pulse:    Resp:    Temp:    SpO2: 99%     Last Pain:  Vitals:   07/05/20 0730  TempSrc:   PainSc: 0-No pain                 [name]

## 2020-07-05 NOTE — Progress Notes (Signed)
Magnesium Check Note  07/05/2020 - 11:20 AM  30 y.o. G1P1001.  Pregnancy complicated by preeclampsia with severe features; elevated P/C ratio, elevatedLFTs, low platelets.  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 She reports baby latched well after delivery. She is tolerating regular diet. Her pain is controlled with PO medication. She has not yet ambulated. Foley catheter still indwelling.  Objective:   Vitals:   07/05/20 0625 07/05/20 0725 07/05/20 0730 07/05/20 0925  BP: 120/72 129/75  (!) 141/98  Pulse: 73 82    Resp:      Temp:      TempSrc:      SpO2: 100% 99% 99%   Weight:      Height:        Current Vital Signs 24h Vital Sign Ranges  T 98.2 F (36.8 C) Temp  Avg: 97.6 F (36.4 C)  Min: 96.7 F (35.9 C)  Max: 98.3 F (36.8 C)  BP (!) 141/98  BP  Min: 114/71  Max: 161/123  HR 82 Pulse  Avg: 80.9  Min: 58  Max: 105  RR 16 Resp  Avg: 15.6  Min: 12  Max: 20  SaO2 99 % Room Air SpO2  Avg: 99.4 %  Min: 75 %  Max: 100 %       24 Hour I/O Current Shift I/O  Time Ins Outs 06/24 0701 - 06/25 0700 In: 5953.2 [P.O.:1920; I.V.:4033.2] Out: 3270 [Urine:3110] 06/25 0701 - 06/25 1900 In: 975 [P.O.:600; I.V.:375] Out: 725 [Urine:725]   Physical Exam: General: NAD Pulm: CTAB CV: RRR Abd: Soft, nontender, nondistended,  +BS Ext: SCDs in place Neuro: DTRs 2+ brachioradialis  Labs:  Recent Labs  Lab 07/04/20 1352 07/04/20 2201 07/05/20 0556  WBC 4.8 11.2* 11.0*  HGB 11.4* 11.7* 11.7*  HCT 33.2* 34.3* 33.8*  PLT 129* 146* 155   Recent Labs  Lab 07/04/20 0738 07/04/20 1352 07/04/20 2201  NA 136 135 135  K 3.5 3.4* 3.4*  CL 110 107 108  CO2 22 23 21*  BUN <5* <5* <5*  CREATININE 0.58 0.63 0.56  CALCIUM 8.6* 8.3* 8.0*  PROT 6.2* 5.9* 6.2*  BILITOT 0.4 0.6 0.5  ALKPHOS 62 64 67  ALT 52* 54* 56*  AST 44* 42* 44*  GLUCOSE 78 80 133*    Medications SCHEDULED MEDICATIONS   ferrous sulfate  325 mg Oral BID WC   ibuprofen  600 mg Oral Q6H   prenatal multivitamin  1 tablet Oral Q1200   senna-docusate  2 tablet Oral Q24H    MEDICATION INFUSIONS   lactated ringers 75 mL/hr at 07/05/20 0944   magnesium sulfate 2 g/hr (07/05/20 0945)    PRN MEDICATIONS  acetaminophen, benzocaine-Menthol, coconut oil, witch hazel-glycerin **AND** dibucaine, diphenhydrAMINE, labetalol **AND** labetalol **AND** labetalol **AND** hydrALAZINE **AND** Measure blood pressure, HYDROcodone-acetaminophen, ondansetron **OR** ondansetron (ZOFRAN) IV, simethicone   Assessment & Plan:  30 y.o. G1P1001 without signs of magnesium toxicity 1) preeclampsia with severe feature, postpartum: continue magnesium 2) plan for discontinuation of magnesium 24 hours from time of delivery. 3) breastfeeding 4) regular diet 5) may use BS commode 6) continue current care   Tresea Mall, CNM 07/05/2020 11:20 AM  [facility]

## 2020-07-06 NOTE — Discharge Instructions (Addendum)
Discharge Instructions:   Follow-up Appointment: Call and make a follow-up appointment for a visit in 1-week with Dr. Jean Rosenthal at [facility] for a BP check and repeat of Edinburgh Questions due to high score upon discharge.  If there are any new medications, they have been ordered and will be available for pickup at the listed pharmacy on your way home from the hospital.   Call office if you have any of the following: headache, visual changes, fever >101.0 F, chills, shortness of breath, breast concerns, excessive vaginal bleeding, incision drainage or problems, leg pain or redness, depression or any other concerns. If you have vaginal discharge with an odor, let your doctor know.   It is normal to bleed for up to 6 weeks. You should not soak through more than 1 pad in 1 hour. If you have a blood clot larger than your fist with continued bleeding, call your doctor.   Activity: Do not lift > 10 lbs for 6 weeks (do not lift anything heavier than your baby). No intercourse, tampons, swimming pools, hot tubs, baths (only showers) for 6 weeks.  No driving for 1-2 weeks. Continue prenatal vitamin, especially if breastfeeding. Increase calories and fluids (water) while breastfeeding.   Your milk will come in, in the next couple of days (right now it is colostrum). You may have a slight fever when your milk comes in, but it should go away on its own.  If it does not, and rises above 101 F please call the doctor. You will also feel achy and your breasts will be firm. They will also start to leak. If you are breastfeeding, continue as you have been and you can pump/express milk for comfort.   If you have too much milk, your breasts can become engorged, which could lead to mastitis. This is an infection of the milk ducts. It can be very painful and you will need to notify your doctor to obtain a prescription for antibiotics. You can also treat it with a shower or hot/cold compress.   For concerns about  your baby, please call your pediatrician.  For breastfeeding concerns, the lactation consultant can be reached at [phone].   Postpartum blues (feelings of happy one minute and sad another minute) are normal for the first few weeks but if it gets worse let your doctor know.   Congratulations! We enjoyed caring for you and your new bundle of joy!

## 2020-07-06 NOTE — TOC Initial Note (Addendum)
Transition of Care [facility]) - Initial/Assessment Note    Patient Details  Name: Rebecca Mccann MRN: [id number] Date of Birth: 03/09/1990  Transition [facility]) CM/SW Contact:    Elliot Gurney [place], [place] Phone Number: 07/06/2020, 2:48 PM  Clinical Narrative:                 CSW received consult due to score 14 on Edinburgh Depression Screen.    Met with patient at bedside. Patient was feeding her newborn when this social worker arrived. Patient confirmed that the FOB is supportive and has a strong network of family and friends, however states that the family dynamic can be stressful at times. Patient denies thoughts of harm to self or others and reports that she has all items needed to provide care for her newborn  CSW provided education regarding Baby Blues vs PMADs.  CSW encouraged MOB to evaluate her mental health throughout the postpartum period using the [place] Postnatal Depression Scale and notify a medical professional if symptoms arise.   Next appointment with OBGYN 07/09/20. Mental Health resources provided if needed.  Caleb Prigmore, LCSW Transition of Care [phone]    Expected Discharge Plan: Home/Self Care Barriers to Discharge: No Barriers Identified   Patient Goals and CMS Choice Patient states their goals for this hospitalization and ongoing recovery are:: to return home      Expected Discharge Plan and Services Expected Discharge Plan: Home/Self Care In-house Referral: Clinical Social Work       Expected Discharge Date: 07/06/20                                    Prior Living Arrangements/Services     Patient language and need for interpreter reviewed:: No Do you feel safe going back to the place where you live?: Yes      Need for Family Participation in Patient Care: No (Comment) Care giver support system in place?: Yes (comment)   Criminal Activity/Legal Involvement Pertinent to Current Situation/Hospitalization: No - Comment as  needed  Activities of Daily Living Home Assistive Devices/Equipment: Eyeglasses (wears contacts) ADL Screening (condition at time of admission) Patient's cognitive ability adequate to safely complete daily activities?: Yes Is the patient deaf or have difficulty hearing?: No Does the patient have difficulty seeing, even when wearing glasses/contacts?: No Does the patient have difficulty concentrating, remembering, or making decisions?: No Patient able to express need for assistance with ADLs?: Yes Does the patient have difficulty dressing or bathing?: No Independently performs ADLs?: Yes (appropriate for developmental age) Does the patient have difficulty walking or climbing stairs?: No Weakness of Legs: None Weakness of Arms/Hands: None  Permission Sought/Granted                  Emotional Assessment Appearance:: Appears stated age Attitude/Demeanor/Rapport: Engaged Affect (typically observed): Accepting, Adaptable Orientation: : Oriented to Self, Oriented to Place, Oriented to  Time, Oriented to Situation Alcohol / Substance Use: Not Applicable Psych Involvement: No (comment)  Admission diagnosis:  Labor and delivery indication for care or intervention [O75.9] Normal labor [O80, Z37.9] Patient Active Problem List   Diagnosis Date Noted   Labor and delivery indication for care or intervention 07/04/2020   Normal labor 07/04/2020   [redacted] weeks gestation of pregnancy 07/04/2020   Preeclampsia, severe, third trimester 07/04/2020   Labor and delivery, indication for care 07/01/2020   [redacted] weeks gestation of pregnancy 07/01/2020   Vaginal  bleeding in pregnancy, third trimester 07/01/2020   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

## 2020-07-06 NOTE — Progress Notes (Signed)
Patient discharged home with infant. FOB present at discharge.Discharge instructions and prescriptions given and reviewed with patient. Patient verbalized understanding.   Explained importance of calling and scheduling a follow-up with Dr. Jean Rosenthal in 1-week for a BP check and mood check (due to [place] questions score). Patient verbalized understanding.   Escorted out by volunteers.

## 2020-07-08 LAB — TYPE AND SCREEN
ABO/RH(D): A POS
Antibody Screen: POSITIVE
Antibody Titer (2): 1:8 {titer}
PT AG Type: POSITIVE
Unit division: 0
Unit division: 0

## 2020-07-08 LAB — BPAM RBC
Blood Product Expiration Date: 202207212359
Blood Product Expiration Date: 202207212359
Unit Type and Rh: 6200
Unit Type and Rh: 6200

## 2020-07-09 ENCOUNTER — Encounter: Payer: Commercial Managed Care - PPO | Admitting: Obstetrics and Gynecology

## 2020-07-23 ENCOUNTER — Telehealth: Payer: Self-pay

## 2020-07-23 NOTE — Telephone Encounter (Signed)
Pt calling after hour nurse 07/20/20 5:52am; delivered 2wks ago; having a lot of pain trying to pass stool; she drank a lot of water and was able to pass stool; is no longer having pain; sxs started ~4am.  After hour nurse instructed pt to increase fiber, drink plenty of fluids; eat fresh fruits and vegetables; prune juice.

## 2020-08-15 ENCOUNTER — Ambulatory Visit (INDEPENDENT_AMBULATORY_CARE_PROVIDER_SITE_OTHER): Payer: Commercial Managed Care - PPO | Admitting: Obstetrics and Gynecology

## 2020-08-15 ENCOUNTER — Other Ambulatory Visit: Payer: Self-pay

## 2020-08-15 ENCOUNTER — Encounter: Payer: Self-pay | Admitting: Obstetrics and Gynecology

## 2020-08-15 DIAGNOSIS — Z30011 Encounter for initial prescription of contraceptive pills: Secondary | ICD-10-CM

## 2020-08-15 MED ORDER — NORETHINDRONE 0.35 MG PO TABS
1.0000 | ORAL_TABLET | Freq: Every day | ORAL | 4 refills | Status: DC
Start: 2020-08-15 — End: 2021-09-03

## 2020-08-15 NOTE — Progress Notes (Signed)
Postpartum Visit  Chief Complaint:  Chief Complaint  Patient presents with   Post-op Follow-up    6 wk postpartum - no concerns. RM 7    History of Present Illness: Patient is a 30 y.o. G1P1001 presents for postpartum visit.  Date of delivery: 07/04/2020 Type of delivery: Vaginal delivery - Vacuum or forceps assisted  no Episiotomy No.  Laceration: yes. Small first degree  Pregnancy or labor problems:  yes, severe preeclampsia  Any problems since the delivery:  no  Newborn Details:  SINGLETON :  1. Baby's name: Love. Birth weight: 2,760 grams Maternal Details:  Breast Feeding:  yes Post partum depression/anxiety noted:  no Edinburgh Post-Partum Depression Score:  10  Date of last PAP: 01/01/2020  normal   Past Medical History:  Diagnosis Date   No known health problems     Past Surgical History:  Procedure Laterality Date   NO PAST SURGERIES      Prior to Admission medications   Medication Sig Start Date End Date Taking? Authorizing Provider  Prenatal Vit-Fe Fumarate-FA (MULTIVITAMIN-PRENATAL) 27-0.8 MG TABS tablet Take 1 tablet by mouth daily at 12 noon.    [provider]    No Known Allergies   Social History   Socioeconomic History   Marital status: Single    Spouse name: Not on file   Number of children: Not on file   Years of education: Not on file   Highest education level: Not on file  Occupational History   Occupation: Comptroller  Tobacco Use   Smoking status: Never   Smokeless tobacco: Never  Vaping Use   Vaping Use: Never used  Substance and Sexual Activity   Alcohol use: Not Currently   Drug use: Never   Sexual activity: Yes    Birth control/protection: None  Other Topics Concern   Not on file  Social History Narrative   Not on file   Social Determinants of Health   Financial Resource Strain: Not on file  Food Insecurity: Not on file  Transportation Needs: Not on file  Physical Activity: Not on file  Stress: Not on  file  Social Connections: Not on file  Intimate Partner Violence: Not on file    Family History  Problem Relation Age of Onset   Hypertension Mother    Cancer Maternal Aunt     Review of Systems  Constitutional: Negative.   HENT: Negative.    Eyes: Negative.   Respiratory: Negative.    Cardiovascular: Negative.   Gastrointestinal: Negative.   Genitourinary: Negative.   Musculoskeletal: Negative.   Skin: Negative.   Neurological: Negative.   Psychiatric/Behavioral: Negative.      Physical Exam BP 118/68   Ht 5\' 4"  (1.626 m)   Wt 153 lb (69.4 kg)   BMI 26.26 kg/m   Physical Exam Constitutional:      General: She is not in acute distress.    Appearance: Normal appearance.  Genitourinary:     Bladder and urethral meatus normal.     No lesions in the vagina.     Right Labia: No rash, tenderness, lesions or skin changes.    Left Labia: No tenderness, lesions, skin changes or rash.    No inguinal adenopathy present in the right or left side.    Pelvic Tanner Score: 5/5.    No vaginal discharge, erythema, bleeding or ulceration.     No vaginal prolapse present.     Right Adnexa: not tender, not full and no mass present.  Left Adnexa: not tender, not full and no mass present.    No cervical motion tenderness, discharge, friability, lesion or polyp.     Uterus is not enlarged, fixed or tender.     Uterus is anteverted.  HENT:     Head: Normocephalic and atraumatic.  Eyes:     General: No scleral icterus.    Conjunctiva/sclera: Conjunctivae normal.  Lymphadenopathy:     Lower Body: No right inguinal adenopathy. No left inguinal adenopathy.  Neurological:     General: No focal deficit present.     Mental Status: She is alert and oriented to person, place, and time.     Cranial Nerves: No cranial nerve deficit.  Psychiatric:        Mood and Affect: Mood normal.        Behavior: Behavior normal.        Judgment: Judgment normal.     Female Chaperone present  during breast and/or pelvic exam.  Assessment: 30 y.o. G1P1001 presenting for 6 week postpartum visit  Plan: Problem List Items Addressed This Visit   None Visit Diagnoses     Postpartum care and examination    -  Primary   Relevant Medications   norethindrone (MICRONOR) 0.35 MG tablet   Encounter for initial prescription of contraceptive pills       Relevant Medications   norethindrone (MICRONOR) 0.35 MG tablet        1) Contraception Education given regarding options for contraception, including oral contraceptives.  2)  Pap - ASCCP guidelines and rational discussed.  Patient opts for routine screening interval  3) Patient underwent screening for postpartum depression with no concerns noted. She scored a 10. States she has bad days and good days, overall doing well.   4) Follow up 1 year for routine annual exam  Thomasene Mohair, MD 08/15/2020 11:33 AM

## 2020-12-03 ENCOUNTER — Telehealth: Payer: Self-pay

## 2020-12-03 NOTE — Telephone Encounter (Signed)
Pt called the after hour nurse 02/02/20 9:17pm states she would like to have another pap smear done experiencing d/c; has had her 6wk pp appt; gave birth 07/04/20; has had d/c for a couple of weeks; at pp appt was told she could try OTC momonistat that helped for a little bit but sxs have come back; no fever.  After hour nurse adv an antifungal medicine OTC; if doesn't help to be seen.  Spoke with pt this am; she states she is going to get an OTC med today.  Adv to be seen if no better and not to use med night before she is seen.

## 2021-09-03 ENCOUNTER — Ambulatory Visit (INDEPENDENT_AMBULATORY_CARE_PROVIDER_SITE_OTHER): Payer: Commercial Managed Care - PPO

## 2021-09-03 VITALS — Wt 162.0 lb

## 2021-09-03 VITALS — BP 102/58 | HR 88 | Ht 64.0 in | Wt 162.0 lb

## 2021-09-03 DIAGNOSIS — Z3201 Encounter for pregnancy test, result positive: Secondary | ICD-10-CM

## 2021-09-03 DIAGNOSIS — Z3480 Encounter for supervision of other normal pregnancy, unspecified trimester: Secondary | ICD-10-CM

## 2021-09-03 DIAGNOSIS — N912 Amenorrhea, unspecified: Secondary | ICD-10-CM

## 2021-09-03 DIAGNOSIS — Z3482 Encounter for supervision of other normal pregnancy, second trimester: Secondary | ICD-10-CM

## 2021-09-03 DIAGNOSIS — Z369 Encounter for antenatal screening, unspecified: Secondary | ICD-10-CM

## 2021-09-03 HISTORY — DX: Encounter for supervision of other normal pregnancy, unspecified trimester: Z34.80

## 2021-09-03 LAB — POCT URINE PREGNANCY: Preg Test, Ur: POSITIVE — AB

## 2021-09-03 NOTE — Progress Notes (Signed)
Subjective:    Rebecca Mccann is a 31 y.o. female who presents for evaluation of amenorrhea. She believes she could be pregnant. Pregnancy is desired.  Last period was normal.   Patient's last menstrual period was 04/18/2021 (within days).    Lab Review Urine HCG: positive    Assessment:    Absence of menstruation.     Plan:    Pregnancy Test:  Positive: EDC: 01/23/2022. Briefly discussed positive results. Given gestational age, she will have OB Intake visit today while in office today. She will schedule lab and NOB upon checkout today.

## 2021-09-03 NOTE — Progress Notes (Signed)
New OB Intake  I connected with  Rebecca Mccann on 09/03/21 at  3:30 PM EDT by telephone and verified that I am speaking with the correct person using two identifiers. Nurse is located at [facility] and pt is located in office.  I explained I am completing New OB Intake today. We discussed her EDD of 01/23/2022 that is based on LMP of 04/18/2021. Pt is G3/P1011. I reviewed her allergies, medications, Medical/Surgical/OB history, and appropriate screenings. Based on history, this is a/an pregnancy uncomplicated .   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  Anatomy US Explained first scheduled [place] will be around 20 weeks.   Labs Discussed genetic screening with patient. Patient desires genetic testing to be drawn with new OB labs. Discussed possible labs to be drawn at new OB appointment.  COVID Vaccine Patient has not had COVID vaccine.   Social Determinants of Health Food Insecurity: denies food insecurity Transportation: Patient denies transportation needs. Childcare: Discussed no children allowed at ultrasound appointments.   First visit review I reviewed new OB appt with pt. I explained she will have ob bloodwork and pap smear/pelvic exam if indicated. Explained pt will be seen by Tresea Mall, CNM at first visit; encounter routed to appropriate provider.    Loran Senters, [facility] 09/03/2021  3:45 PM  Clinical Staff Provider  Office Location  [facility] Dating    Language  English Anatomy US    Flu Vaccine  offer Genetic Screen  NIPS:   TDaP vaccine   offer Hgb A1C or  GTT Early : Third trimester :   Covid declined   LAB RESULTS   Rhogam   Blood Type     Feeding Plan formula Antibody    Contraception Depo or pill Rubella    Circumcision yes RPR     Pediatrician  undecided HBsAg     Support Person Alex HIV    Prenatal Classes no Varicella     GBS  (For PCN allergy, check sensitivities)   BTL Consent  Hep C   VBAC Consent  Pap      Hgb Electro      CF      SMA

## 2021-09-07 ENCOUNTER — Other Ambulatory Visit: Payer: Commercial Managed Care - PPO

## 2021-09-09 ENCOUNTER — Other Ambulatory Visit: Payer: Commercial Managed Care - PPO

## 2021-09-09 DIAGNOSIS — Z3482 Encounter for supervision of other normal pregnancy, second trimester: Secondary | ICD-10-CM

## 2021-09-09 DIAGNOSIS — Z369 Encounter for antenatal screening, unspecified: Secondary | ICD-10-CM

## 2021-09-11 LAB — CBC/D/PLT+RPR+RH+ABO+RUBIGG...
Basophils Absolute: 0 10*3/uL (ref 0.0–0.2)
Basos: 0 %
EOS (ABSOLUTE): 0 10*3/uL (ref 0.0–0.4)
Eos: 1 %
HCV Ab: NONREACTIVE
HIV Screen 4th Generation wRfx: NONREACTIVE
Hematocrit: 33.7 % — ABNORMAL LOW (ref 34.0–46.6)
Hemoglobin: 11.1 g/dL (ref 11.1–15.9)
Hepatitis B Surface Ag: NEGATIVE
Immature Grans (Abs): 0 10*3/uL (ref 0.0–0.1)
Immature Granulocytes: 0 %
Lymphocytes Absolute: 1 10*3/uL (ref 0.7–3.1)
Lymphs: 21 %
MCH: 30 pg (ref 26.6–33.0)
MCHC: 32.9 g/dL (ref 31.5–35.7)
MCV: 91 fL (ref 79–97)
Monocytes Absolute: 0.3 10*3/uL (ref 0.1–0.9)
Monocytes: 5 %
Neutrophils Absolute: 3.5 10*3/uL (ref 1.4–7.0)
Neutrophils: 73 %
Platelets: 239 10*3/uL (ref 150–450)
RBC: 3.7 x10E6/uL — ABNORMAL LOW (ref 3.77–5.28)
RDW: 12.5 % (ref 11.7–15.4)
RPR Ser Ql: NONREACTIVE
Rh Factor: POSITIVE
Rubella Antibodies, IGG: 1.8 index (ref >0.99)
Varicella zoster IgG: 183 index (ref >165)
WBC: 4.9 10*3/uL (ref 3.4–10.8)

## 2021-09-11 LAB — HCV INTERPRETATION

## 2021-09-11 LAB — AB SCR+ANTIBODY ID: Antibody Screen: POSITIVE — AB

## 2021-09-15 LAB — MATERNIT 21 PLUS CORE, BLOOD
Fetal Fraction: 10
Result (T21): NEGATIVE
Trisomy 13 (Patau syndrome): NEGATIVE
Trisomy 18 (Edwards syndrome): NEGATIVE
Trisomy 21 (Down syndrome): NEGATIVE

## 2021-09-16 ENCOUNTER — Ambulatory Visit
Admission: RE | Admit: 2021-09-16 | Discharge: 2021-09-16 | Disposition: A | Discharge status: Home / Self Care | Payer: Commercial Managed Care - PPO | Source: Ambulatory Visit | Attending: Advanced Practice Midwife | Admitting: Advanced Practice Midwife

## 2021-09-16 DIAGNOSIS — Z369 Encounter for antenatal screening, unspecified: Secondary | ICD-10-CM

## 2021-09-16 DIAGNOSIS — Z3689 Encounter for other specified antenatal screening: Secondary | ICD-10-CM | POA: Diagnosis present

## 2021-09-16 DIAGNOSIS — Z3A22 22 weeks gestation of pregnancy: Secondary | ICD-10-CM | POA: Diagnosis not present

## 2021-09-16 DIAGNOSIS — Z3482 Encounter for supervision of other normal pregnancy, second trimester: Secondary | ICD-10-CM

## 2021-09-17 ENCOUNTER — Ambulatory Visit (INDEPENDENT_AMBULATORY_CARE_PROVIDER_SITE_OTHER): Payer: Commercial Managed Care - PPO | Admitting: Advanced Practice Midwife

## 2021-09-17 ENCOUNTER — Encounter: Payer: Self-pay | Admitting: Advanced Practice Midwife

## 2021-09-17 ENCOUNTER — Other Ambulatory Visit (HOSPITAL_COMMUNITY)
Admission: RE | Admit: 2021-09-17 | Discharge: 2021-09-17 | Disposition: A | Discharge status: Home / Self Care | Payer: Commercial Managed Care - PPO | Source: Ambulatory Visit | Attending: Advanced Practice Midwife | Admitting: Advanced Practice Midwife

## 2021-09-17 VITALS — BP 120/80 | Wt 165.0 lb

## 2021-09-17 DIAGNOSIS — O09292 Supervision of pregnancy with other poor reproductive or obstetric history, second trimester: Secondary | ICD-10-CM | POA: Diagnosis not present

## 2021-09-17 DIAGNOSIS — Z369 Encounter for antenatal screening, unspecified: Secondary | ICD-10-CM | POA: Diagnosis present

## 2021-09-17 DIAGNOSIS — Z3482 Encounter for supervision of other normal pregnancy, second trimester: Secondary | ICD-10-CM | POA: Insufficient documentation

## 2021-09-17 DIAGNOSIS — Z113 Encounter for screening for infections with a predominantly sexual mode of transmission: Secondary | ICD-10-CM | POA: Diagnosis present

## 2021-09-17 DIAGNOSIS — Z3A21 21 weeks gestation of pregnancy: Secondary | ICD-10-CM

## 2021-09-17 DIAGNOSIS — Z130 Encounter for screening for diseases of the blood and blood-forming organs and certain disorders involving the immune mechanism: Secondary | ICD-10-CM

## 2021-09-17 DIAGNOSIS — O09299 Supervision of pregnancy with other poor reproductive or obstetric history, unspecified trimester: Secondary | ICD-10-CM | POA: Insufficient documentation

## 2021-09-17 NOTE — Progress Notes (Signed)
New Obstetric Patient H&P    Chief Complaint: "Desires prenatal care"   History of Present Illness: Patient is a 31 y.o. G3P1011 Not Hispanic or Latino female, presents with amenorrhea and positive home pregnancy test. Patient's last menstrual period was 04/18/2021 (within days). and based on her  LMP, her EDD is Estimated Date of Delivery: 01/23/22 and her EGA is [redacted]w[redacted]d. Cycles are 7-8 days, regular, and occur approximately every : 28 days. Her last pap smear was 2 years ago and was no abnormalities.    She had a urine pregnancy test which was positive 1 or 2 month(s)  ago. Her last menstrual period was normal and lasted for  7 or 8 day(s). Since her LMP she claims she has experienced fatigue, nausea, vomiting, exacerbation of eczema, pain with intercourse/vaginal odor, itch, irritation, discharge. Comfort measures reviewed and aptima swab done. She denies vaginal bleeding. Her past medical history is noncontributory. Her prior pregnancies are notable for severe pre-eclampsia, 38w SVD 6#1oz female.  Since her LMP, she admits to the use of tobacco products  no She claims she has gained  15  pounds since the start of her pregnancy.  There are cats in the home in the home  no  She admits close contact with children on a regular basis  yes  She has had chicken pox in the past no She has had Tuberculosis exposures, symptoms, or previously tested positive for TB   no Current or past history of domestic violence. no  Genetic Screening/Teratology Counseling: (Includes patient, baby's father, or anyone in either family with:)   1. Patient's age >/= 27 at [facility]  no 2. Thalassemia ([place], [place], Mediterranean, or Asian background): MCV<80  no 3. Neural tube defect (meningomyelocele, spina bifida, anencephaly)  no 4. Congenital heart defect  no  5. Down syndrome  no 6. Tay-Sachs (Jewish, [place]  no 7. Canavan's Disease  no 8. Sickle cell disease or trait (African)  no  9. Hemophilia or  other blood disorders  no  10. Muscular dystrophy  no  11. Cystic fibrosis  no  12. Huntington's Chorea  no  13. Mental retardation/autism  no 14. Other inherited genetic or chromosomal disorder  no 15. Maternal metabolic disorder (DM, PKU, etc)  no 16. Patient or FOB with a child with a birth defect not listed above no  16a. Patient or FOB with a birth defect themselves no 17. Recurrent pregnancy loss, or stillbirth  no  18. Any medications since LMP other than prenatal vitamins (include vitamins, supplements, OTC meds, drugs, alcohol)  no 19. Any other genetic/environmental exposure to discuss  no  Infection History:   1. Lives with someone with TB or TB exposed  no  2. Patient or partner has history of genital herpes  no 3. Rash or viral illness since LMP  no 4. History of STI (GC, CT, HPV, syphilis, HIV)  remote trichomonal infection 5. History of recent travel :  no  Other pertinent information:  no    Review of Systems:10 point review of systems negative unless otherwise noted in HPI  Past Medical History:  Patient Active Problem List   Diagnosis Date Noted   Hx of preeclampsia, prior pregnancy, currently pregnant 09/17/2021   Supervision of other normal pregnancy, antepartum 09/03/2021     Nursing Staff Provider  Office Location  [facility]  By LMP c/w 22w u/x  Language  English Anatomy US  Complete, normal, female, posterior placenta  Flu Vaccine  Genetic Screen  NIPS: neg/xy  TDaP vaccine    Hgb A1C or  GTT Early : Third trimester :   Covid    LAB RESULTS   Rhogam   Blood Type A/Positive/-- (08/30 1010)   Feeding Plan  Antibody Positive, See Final Results (08/30 1010)  Contraception  Rubella 1.80 (08/30 1010)  Circumcision  RPR Non Reactive (08/30 1010)   Pediatrician   HBsAg Negative (08/30 1010)   Support Person SO Alex, Mom Ann HIV Non Reactive (08/30 1010)  Prenatal Classes  Varicella     GBS  (For PCN allergy, check sensitivities)   BTL Consent      VBAC Consent  Pap  2021 negative    Hgb Electro    Pelvis Tested 6#1oz CF      SMA             Past Surgical History:  Past Surgical History:  Procedure Laterality Date   NO PAST SURGERIES     WISDOM TOOTH EXTRACTION     four; 2020    Gynecologic History: Patient's last menstrual period was 04/18/2021 (within days).  Obstetric History: G3P1011  Family History:  Family History  Problem Relation Age of Onset   Hypertension Mother    Cancer Maternal Aunt 17       breast    Social History:  Social History   Socioeconomic History   Marital status: Single    Spouse name: Not on file   Number of children: 1   Years of education: 12   Highest education level: Not on file  Occupational History   Occupation: Comptroller  Tobacco Use   Smoking status: Never   Smokeless tobacco: Never  Vaping Use   Vaping Use: Never used  Substance and Sexual Activity   Alcohol use: Not Currently   Drug use: Never   Sexual activity: Yes    Partners: Male    Birth control/protection: None  Other Topics Concern   Not on file  Social History Narrative   Not on file   Social Determinants of Health   Financial Resource Strain: Low Risk  (09/03/2021)   Overall Financial Resource Strain (CARDIA)    Difficulty of Paying Living Expenses: Not very hard  Food Insecurity: No Food Insecurity (09/03/2021)   Hunger Vital Sign    Worried About Running Out of Food in the Last Year: Never true    Ran Out of Food in the Last Year: Never true  Transportation Needs: No Transportation Needs (09/03/2021)   PRAPARE - Administrator, Civil Service (Medical): No    Lack of Transportation (Non-Medical): No  Physical Activity: Insufficiently Active (09/03/2021)   Exercise Vital Sign    Days of Exercise per Week: 1 day    Minutes of Exercise per Session: 60 min  Stress: No Stress Concern Present (09/03/2021)   [employer] of Occupational Health - Occupational Stress Questionnaire     Feeling of Stress : Not at all  Social Connections: Unknown (09/03/2021)   Social Connection and Isolation Panel [NHANES]    Frequency of Communication with Friends and Family: Twice a week    Frequency of Social Gatherings with Friends and Family: Once a week    Attends Religious Services: 1 to 4 times per year    Active Member of [employer] or Organizations: No    Attends Banker Meetings: Never    Marital Status: Not on file  Intimate Partner Violence: Not At Risk (09/03/2021)  Humiliation, Afraid, Rape, and Kick questionnaire    Fear of Current or Ex-Partner: No    Emotionally Abused: No    Physically Abused: No    Sexually Abused: No    Allergies:  No Known Allergies  Medications: Prior to Admission medications   Medication Sig Start Date End Date Taking? Authorizing Provider  Prenatal Vit-Fe Fumarate-FA (MULTIVITAMIN-PRENATAL) 27-0.8 MG TABS tablet Take 1 tablet by mouth daily at 12 noon.   Yes [provider]    Physical Exam Vitals: Blood pressure 120/80, weight 165 lb (74.8 kg), last menstrual period 04/18/2021  General: NAD HEENT: normocephalic, anicteric Thyroid: no enlargement, no palpable nodules Pulmonary: No increased work of breathing, CTAB Cardiovascular: RRR, distal pulses 2+ Abdomen: NABS, soft, non-tender, non-distended.  No hepatomegaly, splenomegaly or masses palpable. No evidence of hernia. FHTs 140s Genitourinary: pt self swabbed aptima Extremities: no edema, erythema, or tenderness Neurologic: Grossly intact Psychiatric: mood appropriate, affect full   The following were addressed during this visit:  Breastfeeding Education - Early initiation of breastfeeding    Comments: Keeps milk supply adequate, helps contract uterus and slow bleeding, and early milk is the perfect first food and is easy to digest.   - The importance of exclusive breastfeeding    Comments: Provides antibodies, Lower risk of breast and ovarian cancers, and  type-2 diabetes,Helps your body recover, Reduced chance of SIDS.   - Risks of giving your baby anything other than breast milk if you are breastfeeding    Comments: Make the baby less content with breastfeeds, may make my baby more susceptible to illness, and may reduce my milk supply.   - The importance of early skin-to-skin contact    Comments:  Keeps baby warm and secure, helps keep baby's blood sugar up and breathing steady, easier to bond and breastfeed, and helps calm baby.  - Rooming-in on a 24-hour basis    Comments: Easier to learn baby's feeding cues, easier to bond and get to know each other, and encourages milk production.   - Individualized Education    Comments: Contraindications to breastfeeding and other special medical conditions Patient breast fed her first baby about 4 months. It was a difficult experience and she doesn't want to breastfeed this time.      Assessment: 31 y.o. G3P1011 at [redacted]w[redacted]d presenting to initiate prenatal care  Plan: 1) Avoid alcoholic beverages. 2) Patient encouraged not to smoke.  3) Discontinue the use of all non-medicinal drugs and chemicals.  4) Take prenatal vitamins daily.  5) Nutrition, food safety (fish, cheese advisories, and high nitrite foods) and exercise discussed. 6) Hospital and practice style discussed with cross coverage system.  7) Genetic Screening, such as with 1st Trimester Screening, cell free fetal DNA, AFP testing, and Ultrasound, as well as with amniocentesis and CVS as appropriate, is discussed with patient. At the conclusion of today's visit patient requested genetic testing 8) Patient is asked about travel to areas at risk for the Zika virus, and counseled to avoid travel and exposure to mosquitoes or sexual partners who may have themselves been exposed to the virus. Testing is discussed, and will be ordered as appropriate.  9) Aptima (STD/vaginitis), urine culture, sickle cell screen, CMP, P/C ratio today 10) Return  in 4 weeks for ROB 11) Hx severe preeclampsia: baby ASA, baseline labs, healthy pregnancy lifestyle- diet, exercise   Tresea Mall, CNM [facility] 09/17/2021, 4:41 PM

## 2021-09-17 NOTE — Patient Instructions (Signed)
Exercise During Pregnancy Exercise is an important part of being healthy for people of all ages. Exercise improves the function of your heart and lungs and helps you maintain strength, flexibility, and a healthy body weight. Exercise also boosts energy levels and elevates mood. Most women should exercise regularly during pregnancy. Exercise routines may need to change as your pregnancy progresses. In rare cases, women with certain medical conditions or complications may be asked to limit or avoid exercise during pregnancy. Your health care provider will give you information on what will work for you. How does this affect me? Along with maintaining general strength and flexibility, exercising during pregnancy can help: Keep strength in muscles that are used during labor and childbirth. Decrease low back pain or symptoms of depression. Control weight gain during pregnancy. Reduce the risk of needing insulin if you develop diabetes during pregnancy. Decrease the risk of cesarean delivery. Speed up your recovery after giving birth. Relieve constipation. How does this affect my baby? Exercise can help you have a healthy pregnancy. Exercise does not cause early (premature) birth. It will not cause your baby to weigh less at birth. What exercises can I do? Many exercises are safe for you to do during pregnancy. Do a variety of exercises that safely increase your heart and breathing rates and help you build and maintain muscle strength. Do exercises exactly as told by your health care provider. You may do these exercises: Walking. Swimming. Water aerobics. Riding a stationary bike. Modified yoga or Pilates. Tell your instructor that you are pregnant. Avoid overstretching, and avoid lying on your back for long periods of time. Running or jogging. Choose this type of exercise only if: You ran or jogged regularly before your pregnancy. You can run or jog and still talk in complete sentences. What  exercises should I avoid? You may be told to limit high-intensity exercise depending on your level of fitness and whether you exercised regularly before you were pregnant. You can tell that you are exercising at a high intensity if you are breathing much harder and faster and cannot hold a conversation while exercising. You must avoid: Contact sports. Activities that put you at risk for falling on or being hit in the belly, such as downhill skiing, waterskiing, surfing, rock climbing, cycling, gymnastics, and horseback riding. Scuba diving. Skydiving. Hot yoga or hot Pilates. These activities take place in a room that is heated to high temperatures. Jogging or running, unless you jogged or ran regularly before you were pregnant. While jogging or running, you should always be able to talk in full sentences. Do not run or jog so fast that you are unable to have a conversation. Do not exercise at more than 6,000 feet above sea level (high elevation) if you are not used to exercising at high elevation. How do I exercise in a safe way?  Avoid overheating. Do not exercise in very high temperatures. Wear loose-fitting, breathable clothes. Avoid dehydration. Drink enough fluid before, during, and after exercise to keep your urine pale yellow. Avoid overstretching. Because of hormone changes during pregnancy, it is easy to overstretch muscles, tendons, and ligaments. Start slowly and ask your health care provider to recommend the types of exercise that are safe for you. Do not exercise to lose weight. Wear a sports bra to support your breasts. Avoid standing still or lying flat on your back as much as you can. Follow these instructions at home: Exercise on most days or all days of the week. Try to   exercise for 30 minutes a day, 5 days a week, unless your health care provider tells you not to. If you actively exercised before your pregnancy and you are healthy, your health care provider may tell you to  continue to do moderate-intensity to high-intensity exercise. If you are just starting to exercise or did not exercise much before your pregnancy, your health care provider may tell you to do low-intensity to moderate-intensity exercise. Questions to ask your health care provider Is exercise safe for me? What are signs that I should stop exercising? Does my health condition mean that I should not exercise during pregnancy? When should I avoid exercising during pregnancy? Stop exercising and contact a health care provider if: You have any unusual symptoms such as: Mild contractions of the uterus or cramps in the abdomen. A dizzy feeling that does not go away when you rest. Stop exercising and get help right away if: You have any unusual symptoms such as: Sudden, severe pain in your low back or your belly. Regular, painful contractions of your uterus. Chest pain. Bleeding or fluid leaking from your vagina. Shortness of breath. Headache. Pain and swelling of your calves. Summary Most women should exercise regularly throughout pregnancy. In rare cases, women with certain medical conditions or complications may be asked to limit or avoid exercise during pregnancy. Do not exercise to lose weight during pregnancy. Your health care provider will tell you what level of physical activity is right for you. Stop exercising and contact a health care provider if you have unusual symptoms, such as mild contractions or dizziness. This information is not intended to replace advice given to you by your health care provider. Make sure you discuss any questions you have with your health care provider. Document Revised: 08/15/2019 Document Reviewed: 08/15/2019 Elsevier Patient Education  2023 [employer] Eating Plan for Pregnant Women While you are pregnant, your body requires additional nutrition to help support your growing baby. You also have a higher need for some vitamins and minerals, such as folic  acid, calcium, iron, and vitamin D. Eating a healthy, well-balanced diet is very important for your health and your baby's health. Your need for extra calories varies over the course of your pregnancy. Pregnancy is divided into three trimesters, with each trimester lasting 3 months. For most women, it is recommended to consume: 150 extra calories a day during the first trimester. 300 extra calories a day during the second trimester. 300 extra calories a day during the third trimester. What are tips for following this plan? Cooking Practice good food safety and cleanliness. Wash your hands before you eat and after you prepare raw meat. Wash all fruits and vegetables well before peeling or eating. Taking these actions can help to prevent foodborne illnesses that can be very dangerous to your baby, such as listeriosis. Ask your health care provider for more information about listeriosis. Make sure that all meats, poultry, and eggs are cooked to food-safe temperatures or "well-done." Meal planning  Eat a variety of foods (especially fruits and vegetables) to get a full range of vitamins and minerals. Two or more servings of fish are recommended each week in order to get the most benefits from omega-3 fatty acids that are found in seafood. Choose fish that are lower in mercury, such as salmon and pollock. Limit your overall intake of foods that have "empty calories." These are foods that have little nutritional value, such as sweets, desserts, candies, and sugar-sweetened beverages. Drinks that contain caffeine are okay   to drink, but it is better to avoid caffeine. Keep your total caffeine intake to less than 200 mg each day (which is 12 oz or 355 mL of coffee, tea, or soda) or the limit as told by your health care provider. General information Do not try to lose weight or go on a diet during pregnancy. Take a prenatal vitamin to help meet your additional vitamin and mineral needs during pregnancy,  specifically for folic acid, iron, calcium, and vitamin D. Remember to stay active. Ask your health care provider what types of exercise and activities are safe for you. What does 150 extra calories look like? Healthy options that provide 150 extra calories each day could be any of the following: 6-8 oz (170-227 g) plain low-fat yogurt with  cup (70 g) berries. 1 apple with 2 tsp (11 g) peanut butter. Cut-up vegetables with  cup (60 g) hummus. 8 fl oz (237 mL) low-fat chocolate milk. 1 stick of string cheese with 1 medium orange. 1 peanut butter and jelly sandwich that is made with one slice of whole-wheat bread and 1 tsp (5 g) of peanut butter. For 300 extra calories, you could eat two of these healthy options each day. What is a healthy amount of weight to gain? The right amount of weight gain for you is based on your BMI (body mass index) before you became pregnant. If your BMI was less than 18 (underweight), you should gain 28-40 lb (13-18 kg). If your BMI was 18-24.9 (normal), you should gain 25-35 lb (11-16 kg). If your BMI was 25-29.9 (overweight), you should gain 15-25 lb (7-11 kg). If your BMI was 30 or greater (obese), you should gain 11-20 lb (5-9 kg). What if I am having twins or multiples? Generally, if you are carrying twins or multiples: You may need to eat 300-600 extra calories a day. The recommended range for total weight gain is 25-54 lb (11-25 kg), depending on your BMI before pregnancy. Talk with your health care provider to find out about nutritional needs, weight gain, and exercise that is right for you. What foods should I eat?  Fruits All fruits. Eat a variety of colors and types of fruit. Remember to wash your fruits well before peeling or eating. Vegetables All vegetables. Eat a variety of colors and types of vegetables. Remember to wash your vegetables well before peeling or eating. Grains All grains. Choose whole grains, such as whole-wheat bread, oatmeal,  or brown rice. Meats and other protein foods Lean meats, including chicken, turkey, and lean cuts of beef, veal, or pork. Fish that is higher in omega-3 fatty acids and lower in mercury, such as salmon, herring, mussels, trout, sardines, pollock, shrimp, crab, and lobster. Tofu. Tempeh. Beans. Eggs. Peanut butter and other nut butters. Dairy Pasteurized milk and milk alternatives, such as almond milk. Pasteurized yogurt and pasteurized cheese. Cottage cheese. Sour cream. Beverages Water. Juices that contain 100% fruit juice or vegetable juice. Caffeine-free teas and decaffeinated coffee. Fats and oils Fats and oils are okay to include in moderation. Sweets and desserts Sweets and desserts are okay to include in moderation. Seasoning and other foods All pasteurized condiments. The items listed above may not be a complete list of foods and beverages you can eat. Contact a dietitian for more information. What foods should I avoid? Fruits Raw (unpasteurized) fruit juices. Vegetables Unpasteurized vegetable juices. Meats and other protein foods Precooked or cured meat, such as bologna, hot dogs, sausages, or meat loaves. (If you must eat   those meats, reheat them until they are steaming hot.) Refrigerated pate, meat spreads from a meat counter, or smoked seafood that is found in the refrigerated section of a store. Raw or undercooked meats, poultry, and eggs. Raw fish, such as sushi or sashimi. Fish that have high mercury content, such as tilefish, shark, swordfish, and king mackerel. Dairy Unpasteurized milk and any foods that have unpasteurized milk in them. Soft cheeses, such as feta, queso blanco, queso fresco, Brie, Camembert, panela, and blue-veined cheeses (unless they are made with pasteurized milk, which must be stated on the label). Beverages Alcohol. Sugar-sweetened beverages, such as sodas, teas, or energy drinks. Seasoning and other foods Homemade fermented foods and drinks, such as  pickles, sauerkraut, or kombucha drinks. (Store-bought pasteurized versions of these are okay.) Salads that are made in a store or deli, such as ham salad, chicken salad, egg salad, tuna salad, and seafood salad. The items listed above may not be a complete list of foods and beverages you should avoid. Contact a dietitian for more information. Where to find more information To calculate the number of calories you need based on your height, weight, and activity level, you can use an online calculator such as: www.myplate.gov/myplate-plan To calculate how much weight you should gain during pregnancy, you can use an online pregnancy weight gain calculator such as: www.myplate.gov To learn more about eating fish during pregnancy, talk with your health care provider or visit: www.fda.gov Summary While you are pregnant, your body requires additional nutrition to help support your growing baby. Eat a variety of foods, especially fruits and vegetables, to get a full range of vitamins and minerals. Practice good food safety and cleanliness. Wash your hands before you eat and after you prepare raw meat. Wash all fruits and vegetables well before peeling or eating. Taking these actions can help to prevent foodborne illnesses, such as listeriosis, that can be very dangerous to your baby. Do not eat raw meat or fish. Do not eat fish that have high mercury content, such as tilefish, shark, swordfish, and king mackerel. Do not eat raw (unpasteurized) dairy. Take a prenatal vitamin to help meet your additional vitamin and mineral needs during pregnancy, specifically for folic acid, iron, calcium, and vitamin D. This information is not intended to replace advice given to you by your health care provider. Make sure you discuss any questions you have with your health care provider. Document Revised: 07/26/2019 Document Reviewed: 07/26/2019 Elsevier Patient Education  2023 [employer] Prenatal Care Prenatal care  is health care during pregnancy. It helps you and your unborn baby (fetus) stay as healthy as possible. Prenatal care may be provided by a midwife, a family practice doctor, a mid-level practitioner (nurse practitioner or physician assistant), or a childbirth and pregnancy doctor (obstetrician). How does this affect me? During pregnancy, you will be closely monitored for any new conditions that might develop. To lower your risk of pregnancy complications, you and your health care provider will talk about any underlying conditions you have. How does this affect my baby? Early and consistent prenatal care increases the chance that your baby will be healthy during pregnancy. Prenatal care lowers the risk that your baby will be: Born early (prematurely). Smaller than expected at birth (small for gestational age). What can I expect at the first prenatal care visit? Your first prenatal care visit will likely be the longest. You should schedule your first prenatal care visit as soon as you know that you are pregnant. Your first visit   is a good time to talk about any questions or concerns you have about pregnancy. Medical history At your visit, you and your health care provider will talk about your medical history, including: Any past pregnancies. Your family's medical history. Medical history of the baby's father. Any long-term (chronic) health conditions you have and how you manage them. Any surgeries or procedures you have had. Any current over-the-counter or prescription medicines, herbs, or supplements that you are taking. Other factors that could pose a risk to your baby, including: Exposure to harmful chemicals or radiation at work or at home. Any substance use, including tobacco, alcohol, and drug use. Your home setting and your stress levels, including: Exposure to abuse or violence. Household financial strain. Your daily health habits, including diet and exercise. Tests and screenings Your  health care provider will: Measure your weight, height, and blood pressure. Do a physical exam, including a pelvic and breast exam. Perform blood tests and urine tests to check for: Urinary tract infection. Sexually transmitted infections (STIs). Low iron levels in your blood (anemia). Blood type and certain proteins on red blood cells (Rh antibodies). Infections and immunity to viruses, such as hepatitis B and rubella. HIV (human immunodeficiency virus). Discuss your options for genetic screening. Tips about staying healthy Your health care provider will also give you information about how to keep yourself and your baby healthy, including: Nutrition and taking vitamins. Physical activity. How to manage pregnancy symptoms such as nausea and vomiting (morning sickness). Infections and substances that may be harmful to your baby and how to avoid them. Food safety. Dental care. Working. Travel. Warning signs to watch for and when to call your health care provider. How often will I have prenatal care visits? After your first prenatal care visit, you will have regular visits throughout your pregnancy. The visit schedule is often as follows: Up to week 28 of pregnancy: once every 4 weeks. 28-36 weeks: once every 2 weeks. After 36 weeks: every week until delivery. Some women may have visits more or less often depending on any underlying health conditions and the health of the baby. Keep all follow-up and prenatal care visits. This is important. What happens during routine prenatal care visits? Your health care provider will: Measure your weight and blood pressure. Check for fetal heart sounds. Measure the height of your uterus in your abdomen (fundal height). This may be measured starting around week 20 of pregnancy. Check the position of your baby inside your uterus. Ask questions about your diet, sleeping patterns, and whether you can feel the baby move. Review warning signs to watch  for and signs of labor. Ask about any pregnancy symptoms you are having and how you are dealing with them. Symptoms may include: Headaches. Nausea and vomiting. Vaginal discharge. Swelling. Fatigue. Constipation. Changes in your vision. Feeling persistently sad or anxious. Any discomfort, including back or pelvic pain. Bleeding or spotting. Make a list of questions to ask your health care provider at your routine visits. What tests might I have during prenatal care visits? You may have blood, urine, and imaging tests throughout your pregnancy, such as: Urine tests to check for glucose, protein, or signs of infection. Glucose tests to check for a form of diabetes that can develop during pregnancy (gestational diabetes mellitus). This is usually done around week 24 of pregnancy. Ultrasounds to check your baby's growth and development, to check for birth defects, and to check your baby's well-being. These can also help to decide when you should deliver   your baby. A test to check for group B strep (GBS) infection. This is usually done around week 36 of pregnancy. Genetic testing. This may include blood, fluid, or tissue sampling, or imaging tests, such as an ultrasound. Some genetic tests are done during the first trimester and some are done during the second trimester. What else can I expect during prenatal care visits? Your health care provider may recommend getting certain vaccines during pregnancy. These may include: A yearly flu shot (annual influenza vaccine). This is especially important if you will be pregnant during flu season. Tdap (tetanus, diphtheria, pertussis) vaccine. Getting this vaccine during pregnancy can protect your baby from whooping cough (pertussis) after birth. This vaccine may be recommended between weeks 27 and 36 of pregnancy. A COVID-19 vaccine. Later in your pregnancy, your health care provider may give you information about: Childbirth and breastfeeding  classes. Choosing a health care provider for your baby. Umbilical cord banking. Breastfeeding. Birth control after your baby is born. The hospital labor and delivery unit and how to set up a tour. Registering at the hospital before you go into labor. Where to find more information Office on Women's Health: womenshealth.gov American Pregnancy Association: americanpregnancy.org March of Dimes: marchofdimes.org Summary Prenatal care helps you and your baby stay as healthy as possible during pregnancy. Your first prenatal care visit will most likely be the longest. You will have visits and tests throughout your pregnancy to monitor your health and your baby's health. Bring a list of questions to your visits to ask your health care provider. Make sure to keep all follow-up and prenatal care visits. This information is not intended to replace advice given to you by your health care provider. Make sure you discuss any questions you have with your health care provider. Document Revised: 10/11/2019 Document Reviewed: 10/11/2019 Elsevier Patient Education  2023 [employer]  Managing Anxiety, Adult After being diagnosed with anxiety, you may be relieved to know why you have felt or behaved a certain way. You may also feel overwhelmed about the treatment ahead and what it will mean for your life. With care and support, you can manage this condition. How to manage lifestyle changes Managing stress and anxiety  Stress is your body's reaction to life changes and events, both good and bad. Most stress will last just a few hours, but stress can be ongoing and can lead to more than just stress. Although stress can play a major role in anxiety, it is not the same as anxiety. Stress is usually caused by something external, such as a deadline, test, or competition. Stress normally passes after the triggering event has ended.  Anxiety is caused by something internal, such as imagining a terrible outcome or  worrying that something will go wrong that will devastate you. Anxiety often does not go away even after the triggering event is over, and it can become long-term (chronic) worry. It is important to understand the differences between stress and anxiety and to manage your stress effectively so that it does not lead to an anxious response. Talk with your health care provider or a counselor to learn more about reducing anxiety and stress. He or she may suggest tension reduction techniques, such as: Music therapy. Spend time creating or listening to music that you enjoy and that inspires you. Mindfulness-based meditation. Practice being aware of your normal breaths while not trying to control your breathing. It can be done while sitting or walking. Centering prayer. This involves focusing on a word, phrase,   or sacred image that means something to you and brings you peace. Deep breathing. To do this, expand your stomach and inhale slowly through your nose. Hold your breath for 3-5 seconds. Then exhale slowly, letting your stomach muscles relax. Self-talk. Learn to notice and identify thought patterns that lead to anxiety reactions and change those patterns to thoughts that feel peaceful. Muscle relaxation. Taking time to tense muscles and then relax them. Choose a tension reduction technique that fits your lifestyle and personality. These techniques take time and practice. Set aside 5-15 minutes a day to do them. Therapists can offer counseling and training in these techniques. The training to help with anxiety may be covered by some insurance plans. Other things you can do to manage stress and anxiety include: Keeping a stress diary. This can help you learn what triggers your reaction and then learn ways to manage your response. Thinking about how you react to certain situations. You may not be able to control everything, but you can control your response. Making time for activities that help you relax and  not feeling guilty about spending your time in this way. Doing visual imagery. This involves imagining or creating mental pictures to help you relax. Practicing yoga. Through yoga poses, you can lower tension and promote relaxation.  Medicines Medicines can help ease symptoms. Medicines for anxiety include: Antidepressant medicines. These are usually prescribed for long-term daily control. Anti-anxiety medicines. These may be added in severe cases, especially when panic attacks occur. Medicines will be prescribed by a health care provider. When used together, medicines, psychotherapy, and tension reduction techniques may be the most effective treatment. Relationships Relationships can play a big part in helping you recover. Try to spend more time connecting with trusted friends and family members. Consider going to couples counseling if you have a partner, taking family education classes, or going to family therapy. Therapy can help you and others better understand your condition. How to recognize changes in your anxiety Everyone responds differently to treatment for anxiety. Recovery from anxiety happens when symptoms decrease and stop interfering with your daily activities at home or work. This may mean that you will start to: Have better concentration and focus. Worry will interfere less in your daily thinking. Sleep better. Be less irritable. Have more energy. Have improved memory. It is also important to recognize when your condition is getting worse. Contact your health care provider if your symptoms interfere with home or work and you feel like your condition is not improving. Follow these instructions at home: Activity Exercise. Adults should do the following: Exercise for at least 150 minutes each week. The exercise should increase your heart rate and make you sweat (moderate-intensity exercise). Strengthening exercises at least twice a week. Get the right amount and quality of  sleep. Most adults need 7-9 hours of sleep each night. Lifestyle  Eat a healthy diet that includes plenty of vegetables, fruits, whole grains, low-fat dairy products, and lean protein. Do not eat a lot of foods that are high in fats, added sugars, or salt (sodium). Make choices that simplify your life. Do not use any products that contain nicotine or tobacco. These products include cigarettes, chewing tobacco, and vaping devices, such as e-cigarettes. If you need help quitting, ask your health care provider. Avoid caffeine, alcohol, and certain over-the-counter cold medicines. These may make you feel worse. Ask your pharmacist which medicines to avoid. General instructions Take over-the-counter and prescription medicines only as told by your health care provider. Keep   all follow-up visits. This is important. Where to find support You can get help and support from these sources: Self-help groups. Online and community organizations. A trusted spiritual leader. Couples counseling. Family education classes. Family therapy. Where to find more information You may find that joining a support group helps you deal with your anxiety. The following sources can help you locate counselors or support groups near you: Mental Health America: www.mentalhealthamerica.net Anxiety and Depression Association of America (ADAA): www.adaa.org National Alliance on Mental Illness (NAMI): www.nami.org Contact a health care provider if: You have a hard time staying focused or finishing daily tasks. You spend many hours a day feeling worried about everyday life. You become exhausted by worry. You start to have headaches or frequently feel tense. You develop chronic nausea or diarrhea. Get help right away if: You have a racing heart and shortness of breath. You have thoughts of hurting yourself or others. If you ever feel like you may hurt yourself or others, or have thoughts about taking your own life, get help  right away. Go to your nearest emergency department or: Call your local emergency services (911 in the [place] Call a suicide crisis helpline, such as the National Suicide Prevention Lifeline at 1-800-273-8255 or 988 in the [place] This is open 24 hours a day in the [place] Text the Crisis Text Line at 741741 (in the [place] Summary Taking steps to learn and use tension reduction techniques can help calm you and help prevent triggering an anxiety reaction. When used together, medicines, psychotherapy, and tension reduction techniques may be the most effective treatment. Family, friends, and partners can play a big part in supporting you. This information is not intended to replace advice given to you by your health care provider. Make sure you discuss any questions you have with your health care provider. Document Revised: 07/23/2020 Document Reviewed: 04/20/2020 Elsevier Patient Education  2023 Elsevier Inc. Managing Depression, Adult Depression is a mental health condition that affects your thoughts, feelings, and actions. Being diagnosed with depression can bring you relief if you did not know why you have felt or behaved a certain way. It could also leave you feeling overwhelmed with uncertainty about your future. Preparing yourself to manage your symptoms can help you feel more positive about your future. How to manage lifestyle changes Managing stress  Stress is your body's reaction to life changes and events, both good and bad. Stress can add to your feelings of depression. Learning to manage your stress can help lessen your feelings of depression. Try some of the following approaches to reducing your stress (stress reduction techniques): Listen to music that you enjoy and that inspires you. Try using a meditation app or take a meditation class. Develop a practice that helps you connect with your spiritual self. Walk in nature, pray, or go to a place of worship. Do some deep breathing. To do  this, inhale slowly through your nose. Pause at the top of your inhale for a few seconds and then exhale slowly, letting your muscles relax. Practice yoga to help relax and work your muscles. Choose a stress reduction technique that suits your lifestyle and personality. These techniques take time and practice to develop. Set aside 5-15 minutes a day to do them. Therapists can offer training in these techniques. Other things you can do to manage stress include: Keeping a stress diary. Knowing your limits and saying no when you think something is too much. Paying attention to how you react to certain situations. You   may not be able to control everything, but you can change your reaction. Adding humor to your life by watching funny films or TV shows. Making time for activities that you enjoy and that relax you.  Medicines Medicines, such as antidepressants, are often a part of treatment for depression. Talk with your pharmacist or health care provider about all the medicines, supplements, and herbal products that you take, their possible side effects, and what medicines and other products are safe to take together. Make sure to report any side effects you may have to your health care provider. Relationships Your health care provider may suggest family therapy, couples therapy, or individual therapy as part of your treatment. How to recognize changes Everyone responds differently to treatment for depression. As you recover from depression, you may start to: Have more interest in doing activities. Feel less hopeless. Have more energy. Overeat less often, or have a better appetite. Have better mental focus. It is important to recognize if your depression is not getting better or is getting worse. The symptoms you had in the beginning may return, such as: Tiredness (fatigue) or low energy. Eating too much or too little. Sleeping too much or too little. Feeling restless, agitated, or  hopeless. Trouble focusing or making decisions. Unexplained physical complaints. Feeling irritable, angry, or aggressive. If you or your family members notice these symptoms coming back, let your health care provider know right away. Follow these instructions at home: Activity  Try to get some form of exercise each day, such as walking, biking, swimming, or lifting weights. Practice stress reduction techniques. Engage your mind by taking a class or doing some volunteer work. Lifestyle Get the right amount and quality of sleep. Cut down on using caffeine, tobacco, alcohol, and other potentially harmful substances. Eat a healthy diet that includes plenty of vegetables, fruits, whole grains, low-fat dairy products, and lean protein. Do not eat a lot of foods that are high in solid fats, added sugars, or salt (sodium). General instructions Take over-the-counter and prescription medicines only as told by your health care provider. Keep all follow-up visits as told by your health care provider. This is important. Where to find support Talking to others  Friends and family members can be sources of support and guidance. Talk to trusted friends or family members about your condition. Explain your symptoms to them, and let them know that you are working with a health care provider to treat your depression. Tell friends and family members how they also can be helpful. Finances Find appropriate mental health providers that fit with your financial situation. Talk with your health care provider about options to get reduced prices on your medicines. Where to find more information You can find support in your area from: Anxiety and Depression Association of America (ADAA): www.adaa.org Mental Health America: www.mentalhealthamerica.net National Alliance on Mental Illness: www.nami.org Contact a health care provider if: You stop taking your antidepressant medicines, and you have any of these  symptoms: Nausea. Headache. Light-headedness. Chills and body aches. Not being able to sleep (insomnia). You or your friends and family think your depression is getting worse. Get help right away if: You have thoughts of hurting yourself or others. If you ever feel like you may hurt yourself or others, or have thoughts about taking your own life, get help right away. Go to your nearest emergency department or: Call your local emergency services (911 in the [place] Call a suicide crisis helpline, such as the National Suicide Prevention Lifeline at   1-800-273-8255 or 988 in the [place] This is open 24 hours a day in the [place] Text the Crisis Text Line at 741741 (in the [place] Summary If you are diagnosed with depression, preparing yourself to manage your symptoms is a good way to feel positive about your future. Work with your health care provider on a management plan that includes stress reduction techniques, medicines (if applicable), therapy, and healthy lifestyle habits. Keep talking with your health care provider about how your treatment is working. If you have thoughts about taking your own life, call a suicide crisis helpline or text a crisis text line. This information is not intended to replace advice given to you by your health care provider. Make sure you discuss any questions you have with your health care provider. Document Revised: 07/23/2020 Document Reviewed: 11/08/2018 Elsevier Patient Education  2023 Elsevier Inc. Managing Stress, Adult Feeling a certain amount of stress is normal. Stress helps our body and mind get ready to deal with the demands of life. Stress hormones can motivate you to do well at work and meet your responsibilities. But severe or long-term (chronic) stress can affect your mental and physical health. Chronic stress puts you at higher risk for: Anxiety and depression. Other health problems such as digestive problems, muscle aches, heart disease, high blood  pressure, and stroke. What are the causes? Common causes of stress include: Demands from work, such as deadlines, feeling overworked, or having long hours. Pressures at home, such as money issues, disagreements with a spouse, or parenting issues. Pressures from major life changes, such as divorce, moving, loss of a loved one, or chronic illness. You may be at higher risk for stress-related problems if you: Do not get enough sleep. Are in poor health. Do not have emotional support. Have a mental health disorder such as anxiety or depression. How to recognize stress Stress can make you: Have trouble sleeping. Feel sad, anxious, irritable, or overwhelmed. Lose your appetite. Overeat or want to eat unhealthy foods. Want to use drugs or alcohol. Stress can also cause physical symptoms, such as: Sore, tense muscles, especially in the shoulders and neck. Headaches. Trouble breathing. A faster heart rate. Stomach pain, nausea, or vomiting. Diarrhea or constipation. Trouble concentrating. Follow these instructions at home: Eating and drinking Eat a healthy diet. This includes: Eating foods that are high in fiber, such as beans, whole grains, and fresh fruits and vegetables. Limiting foods that are high in fat and processed sugars, such as fried or sweet foods. Do not skip meals or overeat. Drink enough fluid to keep your urine pale yellow. Alcohol use Do not drink alcohol if: Your health care provider tells you not to drink. You are pregnant, may be pregnant, or are planning to become pregnant. Drinking alcohol is a way some people try to ease their stress. This can be dangerous, so if you drink alcohol: Limit how much you have to: 0-1 drink a day for women. 0-2 drinks a day for men. Know how much alcohol is in your drink. In the [place] one drink equals one 12 oz bottle of beer (355 mL), one 5 oz glass of wine (148 mL), or one 1 oz glass of hard liquor (44 mL). Activity  Include 30  minutes of exercise in your daily schedule. Exercise is a good stress reducer. Include time in your day for an activity that you find relaxing. Try taking a walk, going on a bike ride, reading a book, or listening to music. Schedule your time   in a way that lowers stress, and keep a regular schedule. Focus on doing what is most important to get done. Lifestyle Identify the source of your stress and your reaction to it. See a therapist who can help you change unhelpful reactions. When there are stressful events: Talk about them with family, friends, or coworkers. Try to think realistically about stressful events and not ignore them or overreact. Try to find the positives in a stressful situation and not focus on the negatives. Cut back on responsibilities at work and home, if possible. Ask for help from friends or family members if you need it. Find ways to manage stress, such as: Mindfulness, meditation, or deep breathing. Yoga or tai chi. Progressive muscle relaxation. Spending time in nature. Doing art, playing music, or reading. Making time for fun activities. Spending time with family and friends. Get support from family, friends, or spiritual resources. General instructions Get enough sleep. Try to go to sleep and get up at about the same time every day. Take over-the-counter and prescription medicines only as told by your health care provider. Do not use any products that contain nicotine or tobacco. These products include cigarettes, chewing tobacco, and vaping devices, such as e-cigarettes. If you need help quitting, ask your health care provider. Do not use drugs or smoke to deal with stress. Keep all follow-up visits. This is important. Where to find support Talk with your health care provider about stress management or finding a support group. Find a therapist to work with you on your stress management techniques. Where to find more information National Alliance on Mental  Illness: www.nami.org American Psychological Association: www.apa.org Contact a health care provider if: Your stress symptoms get worse. You are unable to manage your stress at home. You are struggling to stop using drugs or alcohol. Get help right away if: You may be a danger to yourself or others. You have any thoughts of death or suicide. Get help right awayif you feel like you may hurt yourself or others, or have thoughts about taking your own life. Go to your nearest emergency room or: Call 911. Call the National Suicide Prevention Lifeline at 1-800-273-8255 or 988 in the [place] This is open 24 hours a day. Text the Crisis Text Line at 741741. Summary Feeling a certain amount of stress is normal, but severe or long-term (chronic) stress can affect your mental and physical health. Chronic stress can put you at higher risk for anxiety, depression, and other health problems such as digestive problems, muscle aches, heart disease, high blood pressure, and stroke. You may be at higher risk for stress-related problems if you do not get enough sleep, are in poor health, lack emotional support, or have a mental health disorder such as anxiety or depression. Identify the source of your stress and your reaction to it. Try talking about stressful events with family, friends, or coworkers, finding a coping method, or getting support from spiritual resources. If you need more help, talk with your health care provider about finding a support group or a mental health therapist. This information is not intended to replace advice given to you by your health care provider. Make sure you discuss any questions you have with your health care provider. Document Revised: 07/24/2020 Document Reviewed: 07/22/2020 Elsevier Patient Education  2023 Elsevier Inc.  

## 2021-09-18 LAB — PROTEIN / CREATININE RATIO, URINE
Creatinine, Urine: 125.5 mg/dL
Protein, Ur: 6.9 mg/dL
Protein/Creat Ratio: 55 mg/g creat (ref 0–200)

## 2021-09-20 LAB — URINE CULTURE

## 2021-09-21 LAB — CERVICOVAGINAL ANCILLARY ONLY
Bacterial Vaginitis (gardnerella): NEGATIVE
Candida Glabrata: NEGATIVE
Candida Vaginitis: POSITIVE — AB
Chlamydia: NEGATIVE
Comment: NEGATIVE
Comment: NEGATIVE
Comment: NEGATIVE
Comment: NEGATIVE
Comment: NEGATIVE
Comment: NORMAL
Neisseria Gonorrhea: NEGATIVE
Trichomonas: NEGATIVE

## 2021-09-22 LAB — COMPREHENSIVE METABOLIC PANEL
ALT: 11 IU/L (ref 0–32)
AST: 10 IU/L (ref 0–40)
Albumin/Globulin Ratio: 1.7 (ref 1.2–2.2)
Albumin: 4 g/dL (ref 3.9–4.9)
Alkaline Phosphatase: 38 IU/L — ABNORMAL LOW (ref 44–121)
BUN/Creatinine Ratio: 12 (ref 9–23)
BUN: 9 mg/dL (ref 6–20)
Bilirubin Total: <0.2 mg/dL (ref 0.0–1.2)
CO2: 19 mmol/L — ABNORMAL LOW (ref 20–29)
Calcium: 9.2 mg/dL (ref 8.7–10.2)
Chloride: 103 mmol/L (ref 96–106)
Creatinine, Ser: 0.76 mg/dL (ref 0.57–1.00)
Globulin, Total: 2.4 g/dL (ref 1.5–4.5)
Glucose: 93 mg/dL (ref 70–99)
Potassium: 4.1 mmol/L (ref 3.5–5.2)
Sodium: 138 mmol/L (ref 134–144)
Total Protein: 6.4 g/dL (ref 6.0–8.5)
eGFR: 107 mL/min/{1.73_m2} (ref >59)

## 2021-09-22 LAB — HGB FRACTIONATION CASCADE
Hgb A2: 2.9 % (ref 1.8–3.2)
Hgb A: 96.1 % — ABNORMAL LOW (ref 96.4–98.8)
Hgb F: 1 % (ref 0.0–2.0)
Hgb S: 0 %

## 2021-09-23 ENCOUNTER — Other Ambulatory Visit: Payer: Self-pay | Admitting: Advanced Practice Midwife

## 2021-09-23 DIAGNOSIS — B3731 Acute candidiasis of vulva and vagina: Secondary | ICD-10-CM

## 2021-09-23 MED ORDER — TERCONAZOLE 0.8 % VA CREA: 1.0000 | TOPICAL_CREAM | Freq: Every day | VAGINAL | 0 refills | Status: DC

## 2021-09-23 NOTE — Progress Notes (Signed)
Rx terconazole sent to treat yeast vaginitis, message sent to patient.

## 2021-10-15 ENCOUNTER — Ambulatory Visit (INDEPENDENT_AMBULATORY_CARE_PROVIDER_SITE_OTHER): Payer: Commercial Managed Care - PPO | Admitting: Obstetrics

## 2021-10-15 VITALS — BP 114/68 | Wt 165.0 lb

## 2021-10-15 DIAGNOSIS — Z3A25 25 weeks gestation of pregnancy: Secondary | ICD-10-CM

## 2021-10-15 DIAGNOSIS — O09292 Supervision of pregnancy with other poor reproductive or obstetric history, second trimester: Secondary | ICD-10-CM | POA: Diagnosis not present

## 2021-10-15 DIAGNOSIS — Z3480 Encounter for supervision of other normal pregnancy, unspecified trimester: Secondary | ICD-10-CM

## 2021-10-15 DIAGNOSIS — Z3482 Encounter for supervision of other normal pregnancy, second trimester: Secondary | ICD-10-CM

## 2021-10-15 LAB — POCT URINALYSIS DIPSTICK OB
Glucose, UA: NEGATIVE
POC,PROTEIN,UA: NEGATIVE

## 2021-10-15 NOTE — Progress Notes (Signed)
Routine Prenatal Care Visit  Subjective  Rebecca Mccann is a 31 y.o. G3P1011 at [redacted]w[redacted]d being seen today for ongoing prenatal care.  She is currently monitored for the following issues for this low-risk pregnancy and has Supervision of other normal pregnancy, antepartum and Hx of preeclampsia, prior pregnancy, currently pregnant on their problem list.  ----------------------------------------------------------------------------------- Patient reports no complaints.  This is only her second OB appt. She started care late. Contractions: Not present. Vag. Bleeding: None.  Movement: Present. Leaking Fluid denies.  ----------------------------------------------------------------------------------- The following portions of the patient's history were reviewed and updated as appropriate: allergies, current medications, past family history, past medical history, past social history, past surgical history and problem list. Problem list updated.  Objective  Blood pressure 114/68, weight 74.8 kg, last menstrual period 04/18/2021, not currently breastfeeding. Pregravid weight 68 kg Total Weight Gain 6.804 kg Urinalysis: Urine Protein    Urine Glucose    Fetal Status:     Movement: Present     General:  Alert, oriented and cooperative. Patient is in no acute distress.  Skin: Skin is warm and dry. No rash noted.   Cardiovascular: Normal heart rate noted  Respiratory: Normal respiratory effort, no problems with respiration noted  Abdomen: Soft, gravid, appropriate for gestational age. Pain/Pressure: Absent     Pelvic:  Cervical exam deferred        Extremities: Normal range of motion.     Mental Status: Normal mood and affect. Normal behavior. Normal judgment and thought content.   Assessment   31 y.o. G3P1011 at [redacted]w[redacted]d by  01/23/2022, by Last Menstrual Period presenting for routine prenatal visit  Plan   third Problems (from 09/03/21 to present)    Problem Noted Resolved   Hx of preeclampsia, prior  pregnancy, currently pregnant 09/17/2021 by Rod Can, CNM No   Supervision of other normal pregnancy, antepartum 09/03/2021 by Cleophas Dunker, [place] No   Overview Addendum 09/17/2021  4:54 PM by Rod Can, CNM     Nursing Staff Provider  Office Location  [facility] Dating  By LMP c/w 22w u/s  Language  English Anatomy US  Complete, normal, female, posterior placenta  Flu Vaccine   Genetic Screen  NIPS: neg/xy  TDaP vaccine    Hgb A1C or  GTT Early : Third trimester :   Covid    LAB RESULTS   Rhogam   Blood Type A/Positive/-- (08/30 1010)   Feeding Plan  Antibody Positive, See Final Results (08/30 1010)  Contraception  Rubella 1.80 (08/30 1010)  Circumcision  RPR Non Reactive (08/30 1010)   Pediatrician   HBsAg Negative (08/30 1010)   Support Person SO Cristie Hem, Mom Ann HIV Non Reactive (08/30 1010)  Prenatal Classes  Varicella     GBS  (For PCN allergy, check sensitivities)   BTL Consent     VBAC Consent  Pap  2021 negative    Hgb Electro    Pelvis Tested 6#1oz CF      SMA                   Preterm labor symptoms and general obstetric precautions including but not limited to vaginal bleeding, contractions, leaking of fluid and fetal movement were reviewed in detail with the patient. Please refer to After Visit Summary for other counseling recommendations.   Return in about 3 weeks (around 11/05/2021) for return OB, 28 week labs.  Imagene Riches, CNM  10/15/2021 3:54 PM

## 2021-10-15 NOTE — Addendum Note (Signed)
Addended by: Quintella Baton D on: 10/15/2021 04:08 PM   Modules accepted: Orders

## 2021-11-05 ENCOUNTER — Other Ambulatory Visit: Payer: Commercial Managed Care - PPO

## 2021-11-05 ENCOUNTER — Telehealth: Payer: Self-pay | Admitting: Advanced Practice Midwife

## 2021-11-05 ENCOUNTER — Encounter: Payer: Commercial Managed Care - PPO | Admitting: Advanced Practice Midwife

## 2021-11-05 NOTE — Telephone Encounter (Signed)
Reached out to pt to reschedule 28 wk labs and OB appt with JEG that was scheduled for 10/26 at 8:20 (lab) and OB (8:55).  Left message for pt to call back to reschedule.

## 2021-11-06 ENCOUNTER — Encounter: Payer: Self-pay | Admitting: Advanced Practice Midwife

## 2021-11-06 NOTE — Telephone Encounter (Signed)
Reached out to pt (2x) to reschedule 28 wk labs and OB appt with JEG that was scheduled for 10/26 at 8:20 (lab) and OB (8:55).  Will send a MyChart letter.

## 2022-01-11 NOTE — L&D Delivery Note (Signed)
Date of delivery: 01/16/2022 Estimated Date of Delivery: 01/23/22 Patient's last menstrual period was 04/18/2021 (within days). EGA: [redacted]w[redacted]d  Delivery Note At 1:53 PM a viable female was delivered via Vaginal, Spontaneous  Presentation: OA, Right Occiput Anterior   APGAR: 7, 9;     Weight 6 lb 11.2 oz (3040 g).   Placenta status: Spontaneous, Intact.   Cord: 3 vessels with the following complications: None.  Cord pH: NA  Called to see patient.  Mom pushed to deliver a viable female infant.  The head followed by shoulders, which delivered without difficulty, and the rest of the body.  No nuchal cord noted.  Baby to mom's chest.  Cord clamped and cut after 3 min delay.  No cord blood obtained.  Placenta delivered spontaneously, intact, with a 3-vessel cord.   All counts correct.  Hemostasis obtained with IV pitocin and fundal massage.      Anesthesia: Epidural Episiotomy: None Lacerations: perineal abrasion hemostatic Suture Repair:  NA Est. Blood Loss (mL): 200  Mom to postpartum.  Baby to Couplet care / Skin to Skin.   Rod Can, CNM 01/16/2022, 2:19 PM

## 2022-01-16 ENCOUNTER — Inpatient Hospital Stay: Payer: Commercial Managed Care - PPO | Admitting: Anesthesiology

## 2022-01-16 ENCOUNTER — Other Ambulatory Visit: Payer: Self-pay

## 2022-01-16 ENCOUNTER — Inpatient Hospital Stay
Admission: EM | Admit: 2022-01-16 | Discharge: 2022-01-17 | DRG: 806 | Disposition: A | Discharge status: Home / Self Care | Payer: Commercial Managed Care - PPO | Attending: Advanced Practice Midwife | Admitting: Advanced Practice Midwife

## 2022-01-16 ENCOUNTER — Encounter: Payer: Self-pay | Admitting: Advanced Practice Midwife

## 2022-01-16 DIAGNOSIS — O0933 Supervision of pregnancy with insufficient antenatal care, third trimester: Secondary | ICD-10-CM | POA: Diagnosis not present

## 2022-01-16 DIAGNOSIS — O9902 Anemia complicating childbirth: Principal | ICD-10-CM | POA: Diagnosis present

## 2022-01-16 DIAGNOSIS — D62 Acute posthemorrhagic anemia: Secondary | ICD-10-CM | POA: Diagnosis not present

## 2022-01-16 DIAGNOSIS — Z3A39 39 weeks gestation of pregnancy: Secondary | ICD-10-CM

## 2022-01-16 DIAGNOSIS — O26893 Other specified pregnancy related conditions, third trimester: Secondary | ICD-10-CM | POA: Diagnosis present

## 2022-01-16 DIAGNOSIS — O09299 Supervision of pregnancy with other poor reproductive or obstetric history, unspecified trimester: Principal | ICD-10-CM

## 2022-01-16 DIAGNOSIS — O09293 Supervision of pregnancy with other poor reproductive or obstetric history, third trimester: Secondary | ICD-10-CM

## 2022-01-16 DIAGNOSIS — Z23 Encounter for immunization: Secondary | ICD-10-CM

## 2022-01-16 DIAGNOSIS — O9903 Anemia complicating the puerperium: Secondary | ICD-10-CM

## 2022-01-16 DIAGNOSIS — Z3480 Encounter for supervision of other normal pregnancy, unspecified trimester: Secondary | ICD-10-CM

## 2022-01-16 HISTORY — DX: Other specified health status: Z78.9

## 2022-01-16 LAB — CBC
HCT: 39 % (ref 36.0–46.0)
Hemoglobin: 12.9 g/dL (ref 12.0–15.0)
MCH: 29 pg (ref 26.0–34.0)
MCHC: 33.1 g/dL (ref 30.0–36.0)
MCV: 87.6 fL (ref 80.0–100.0)
Platelets: 173 K/uL (ref 150–400)
RBC: 4.45 MIL/uL (ref 3.87–5.11)
RDW: 13.5 % (ref 11.5–15.5)
WBC: 5.6 K/uL (ref 4.0–10.5)
nRBC: 0 % (ref 0.0–0.2)

## 2022-01-16 LAB — CHLAMYDIA/NGC RT PCR (ARMC ONLY)
Chlamydia Tr: NOT DETECTED
N gonorrhoeae: NOT DETECTED

## 2022-01-16 LAB — GROUP B STREP BY PCR: Group B strep by PCR: NEGATIVE

## 2022-01-16 MED ORDER — FENTANYL-BUPIVACAINE-NACL 0.5-0.125-0.9 MG/250ML-% EP SOLN
EPIDURAL | Status: AC
  Filled 2022-01-16: qty 250

## 2022-01-16 MED ORDER — MISOPROSTOL 200 MCG PO TABS
ORAL_TABLET | ORAL | Status: AC
  Filled 2022-01-16: qty 4

## 2022-01-16 MED ORDER — LIDOCAINE-EPINEPHRINE (PF) 1.5 %-1:200000 IJ SOLN
INTRAMUSCULAR | Status: DC | PRN
  Administered 2022-01-16: 3 mL via EPIDURAL

## 2022-01-16 MED ORDER — LIDOCAINE HCL (PF) 1 % IJ SOLN
INTRAMUSCULAR | Status: DC | PRN
  Administered 2022-01-16: 3 mL via SUBCUTANEOUS

## 2022-01-16 MED ORDER — LIDOCAINE HCL (PF) 1 % IJ SOLN: 30.0000 mL | INTRAMUSCULAR | Status: DC | PRN

## 2022-01-16 MED ORDER — OXYTOCIN-SODIUM CHLORIDE 30-0.9 UT/500ML-% IV SOLN
2.5000 [IU]/h | INTRAVENOUS | Status: DC
  Administered 2022-01-16: 2.5 [IU]/h via INTRAVENOUS

## 2022-01-16 MED ORDER — BENZOCAINE-MENTHOL 20-0.5 % EX AERO: 1.0000 | INHALATION_SPRAY | CUTANEOUS | Status: DC | PRN

## 2022-01-16 MED ORDER — ONDANSETRON HCL 4 MG/2ML IJ SOLN: 4.0000 mg | Freq: Four times a day (QID) | INTRAMUSCULAR | Status: DC | PRN

## 2022-01-16 MED ORDER — SENNOSIDES-DOCUSATE SODIUM 8.6-50 MG PO TABS
2.0000 | ORAL_TABLET | Freq: Every day | ORAL | Status: DC
  Administered 2022-01-17: 2 via ORAL
  Filled 2022-01-16: qty 2

## 2022-01-16 MED ORDER — WITCH HAZEL-GLYCERIN EX PADS: 1.0000 | MEDICATED_PAD | CUTANEOUS | Status: DC | PRN

## 2022-01-16 MED ORDER — COCONUT OIL OIL: 1.0000 | TOPICAL_OIL | Status: DC | PRN

## 2022-01-16 MED ORDER — OXYTOCIN BOLUS FROM INFUSION
333.0000 mL | Freq: Once | INTRAVENOUS | Status: AC
  Administered 2022-01-16: 333 mL via INTRAVENOUS

## 2022-01-16 MED ORDER — SODIUM CHLORIDE 0.9 % IV SOLN
INTRAVENOUS | Status: DC | PRN
  Administered 2022-01-16 (×2): 5 mL via EPIDURAL

## 2022-01-16 MED ORDER — AMMONIA AROMATIC IN INHA
RESPIRATORY_TRACT | Status: AC
  Filled 2022-01-16: qty 10

## 2022-01-16 MED ORDER — FENTANYL CITRATE (PF) 100 MCG/2ML IJ SOLN
50.0000 ug | Freq: Once | INTRAMUSCULAR | Status: AC
  Administered 2022-01-16: 50 ug via INTRAVENOUS
  Filled 2022-01-16: qty 2

## 2022-01-16 MED ORDER — PHENYLEPHRINE 80 MCG/ML (10ML) SYRINGE FOR IV PUSH (FOR BLOOD PRESSURE SUPPORT): 80.0000 ug | PREFILLED_SYRINGE | INTRAVENOUS | Status: DC | PRN

## 2022-01-16 MED ORDER — TETANUS-DIPHTH-ACELL PERTUSSIS 5-2.5-18.5 LF-MCG/0.5 IM SUSY
0.5000 mL | PREFILLED_SYRINGE | Freq: Once | INTRAMUSCULAR | Status: AC
  Administered 2022-01-17: 0.5 mL via INTRAMUSCULAR
  Filled 2022-01-16: qty 0.5

## 2022-01-16 MED ORDER — EPHEDRINE 5 MG/ML INJ: 10.0000 mg | INTRAVENOUS | Status: DC | PRN

## 2022-01-16 MED ORDER — LACTATED RINGERS IV SOLN: 500.0000 mL | INTRAVENOUS | Status: DC | PRN

## 2022-01-16 MED ORDER — OXYTOCIN 10 UNIT/ML IJ SOLN
INTRAMUSCULAR | Status: AC
  Filled 2022-01-16: qty 2

## 2022-01-16 MED ORDER — DIPHENHYDRAMINE HCL 50 MG/ML IJ SOLN: 12.5000 mg | INTRAMUSCULAR | Status: DC | PRN

## 2022-01-16 MED ORDER — IBUPROFEN 600 MG PO TABS
600.0000 mg | ORAL_TABLET | Freq: Four times a day (QID) | ORAL | Status: DC
  Administered 2022-01-16 – 2022-01-17 (×3): 600 mg via ORAL
  Filled 2022-01-16 (×3): qty 1

## 2022-01-16 MED ORDER — DIBUCAINE (PERIANAL) 1 % EX OINT: 1.0000 | TOPICAL_OINTMENT | CUTANEOUS | Status: DC | PRN

## 2022-01-16 MED ORDER — PRENATAL MULTIVITAMIN CH
1.0000 | ORAL_TABLET | Freq: Every day | ORAL | Status: DC
  Administered 2022-01-17: 1 via ORAL

## 2022-01-16 MED ORDER — ONDANSETRON HCL 4 MG PO TABS: 4.0000 mg | ORAL_TABLET | ORAL | Status: DC | PRN

## 2022-01-16 MED ORDER — LIDOCAINE HCL (PF) 1 % IJ SOLN
INTRAMUSCULAR | Status: AC
  Filled 2022-01-16: qty 30

## 2022-01-16 MED ORDER — SIMETHICONE 80 MG PO CHEW: 80.0000 mg | CHEWABLE_TABLET | ORAL | Status: DC | PRN

## 2022-01-16 MED ORDER — ACETAMINOPHEN 325 MG PO TABS: 650.0000 mg | ORAL_TABLET | ORAL | Status: DC | PRN

## 2022-01-16 MED ORDER — ONDANSETRON HCL 4 MG/2ML IJ SOLN: 4.0000 mg | INTRAMUSCULAR | Status: DC | PRN

## 2022-01-16 MED ORDER — OXYTOCIN-SODIUM CHLORIDE 30-0.9 UT/500ML-% IV SOLN
INTRAVENOUS | Status: AC
  Filled 2022-01-16: qty 500

## 2022-01-16 MED ORDER — DIPHENHYDRAMINE HCL 25 MG PO CAPS: 25.0000 mg | ORAL_CAPSULE | Freq: Four times a day (QID) | ORAL | Status: DC | PRN

## 2022-01-16 MED ORDER — FENTANYL-BUPIVACAINE-NACL 0.5-0.125-0.9 MG/250ML-% EP SOLN
12.0000 mL/h | EPIDURAL | Status: DC | PRN
  Administered 2022-01-16: 12 mL/h via EPIDURAL

## 2022-01-16 MED ORDER — LACTATED RINGERS IV SOLN
500.0000 mL | Freq: Once | INTRAVENOUS | Status: AC
  Administered 2022-01-16: 500 mL via INTRAVENOUS

## 2022-01-16 MED ORDER — LACTATED RINGERS IV SOLN: INTRAVENOUS | Status: DC

## 2022-01-16 NOTE — H&P (Signed)
OB History & Physical   History of Present Illness:  Chief Complaint: contractions, bloody show  HPI:  Rebecca Mccann is a 32 y.o. G64P1011 female at [redacted]w[redacted]d dated by LMP.  Her pregnancy has been complicated by late and limited prenatal care (only 2 prenatal appointments, no 28 or 36 week labs, Positive antibody screen- weak Anti E too low to titer, history of preeclampsia- baseline labs normal at 22 weeks.  She reports contractions since 6:15 this morning.   She denies leakage of fluid.   She reports vaginal bleeding.   She reports fetal movement.  She denies headache, visual changes or epigastric pain.  Total weight gain for pregnancy: 9.072 kg   Obstetrical Problem List: third Problems (from 09/03/21 to present)     Problem Noted Resolved   Hx of preeclampsia, prior pregnancy, currently pregnant 09/17/2021 by Tresea Mall, CNM No   Supervision of other normal pregnancy, antepartum 09/03/2021 by Loran Senters, CMA No   Overview Addendum 10/15/2021  4:05 PM by Mirna Mires, CNM     Nursing Staff Provider  Office Location  [facility] Dating  By LMP c/w 22w u/s  Language  English Anatomy US  Complete, normal, female, posterior placenta  Flu Vaccine   Genetic Screen  NIPS: neg/xy  TDaP vaccine    Hgb A1C or  GTT Early : Third trimester :   Covid    LAB RESULTS   Rhogam   Blood Type A/Positive/-- (08/30 1010)   Feeding Plan Pump for breast milk? Antibody Positive, See Final Results (08/30 1010)  Contraception  Rubella 1.80 (08/30 1010)  Circumcision  RPR Non Reactive (08/30 1010)   Pediatrician   HBsAg Negative (08/30 1010)   Support Person SO Alex, Mom Ann HIV Non Reactive (08/30 1010)  Prenatal Classes  Varicella     GBS  (For PCN allergy, check sensitivities)   BTL Consent     VBAC Consent  Pap  2021 negative    Hgb Electro    Pelvis Tested 6#1oz CF      SMA         Baseline preeclampsia labs normal at 22 weeks          Maternal Medical History:   Past Medical History:   Diagnosis Date   Medical history non-contributory    No known health problems     Past Surgical History:  Procedure Laterality Date   NO PAST SURGERIES     WISDOM TOOTH EXTRACTION     four; 2020    No Known Allergies  Prior to Admission medications   Medication Sig Start Date End Date Taking? Authorizing Provider  Prenatal Vit-Fe Fumarate-FA (MULTIVITAMIN-PRENATAL) 27-0.8 MG TABS tablet Take 1 tablet by mouth daily at 12 noon.   Yes [provider]  terconazole (TERAZOL 3) 0.8 % vaginal cream Place 1 applicator vaginally at bedtime. Patient not taking: Reported on 01/16/2022 09/23/21   Tresea Mall, CNM    OB History  Gravida Para Term Preterm AB Living  3 1 1   1 1   SAB IAB Ectopic Multiple Live Births    1   0 1    # Outcome Date GA Lbr Len/2nd Weight Sex Delivery Anes PTL Lv  3 Current           2 Term 07/04/20 [redacted]w[redacted]d / 00:30 2760 g F Vag-Spont EPI  LIV  1 IAB 2009            Prenatal care site: [facility]  Social  History: She  reports that she has never smoked. She has never used smokeless tobacco. She reports that she does not currently use alcohol. She reports that she does not use drugs.  Family History: family history includes Cancer (age of onset: 46) in her maternal aunt; Hypertension in her mother.    Review of Systems:  Review of Systems  Constitutional:  Negative for chills and fever.  HENT:  Negative for congestion, ear discharge, ear pain, hearing loss, sinus pain and sore throat.   Eyes:  Negative for blurred vision and double vision.  Respiratory:  Negative for cough, shortness of breath and wheezing.   Cardiovascular:  Negative for chest pain, palpitations and leg swelling.  Gastrointestinal:  Positive for abdominal pain. Negative for blood in stool, constipation, diarrhea, heartburn, melena, nausea and vomiting.  Genitourinary:  Negative for dysuria, flank pain, frequency, hematuria and urgency.  Musculoskeletal:  Negative for back pain,  joint pain and myalgias.  Skin:  Negative for itching and rash.  Neurological:  Negative for dizziness, tingling, tremors, sensory change, speech change, focal weakness, seizures, loss of consciousness, weakness and headaches.  Endo/Heme/Allergies:  Negative for environmental allergies. Does not bruise/bleed easily.  Psychiatric/Behavioral:  Negative for depression, hallucinations, memory loss, substance abuse and suicidal ideas. The patient is not nervous/anxious and does not have insomnia.      Physical Exam:  BP 120/83    Temp 98.1 F (36.7 C) (Oral)   Resp 16   Ht 5\' 5"  (1.651 m)   Wt 77.1 kg   LMP 04/18/2021 (Within Days)   BMI 28.29 kg/m   Constitutional: Well nourished, well developed female in no acute distress.  HEENT: normal Skin: Warm and dry.  Cardiovascular: Regular rate and rhythm.   Extremity:  no edema   Respiratory: Clear to auscultation bilateral. Normal respiratory effort Abdomen: FHT present Back: no CVAT Psych: Alert and Oriented x3. No memory deficits. Normal mood and affect.    Pelvic exam: per RN L. Elks 6/90/-2, bag of water intact    Baseline FHR: 120 beats/min   Variability: moderate   Accelerations: present   Decelerations: absent Contractions: present frequency: every 4-6 Overall assessment: reassuring    Lab Results  Component Value Date   [place] NEGATIVE 07/04/2020    Assessment:  Rebecca Mccann is a 32 y.o. G72P1011 female at [redacted]w[redacted]d with active labor.   Plan:  Admit to Labor & Delivery  CBC, T&S, Clrs, IVF GBS PCR/GC/CT: pending.   Fetal well-being: Category I Expectant management for vaginal delivery    Rod Can, CNM 01/16/2022 10:30 AM

## 2022-01-16 NOTE — Discharge Summary (Signed)
OB Discharge Summary     Patient Name: Rebecca Mccann DOB: 05/09/1990 MRN: [id number]  Date of admission: 01/16/2022 Delivering provider: Rod Can, CNM  Date of Delivery: 01/16/2022  Date of discharge: 01/17/2022  Admitting diagnosis: Labor and delivery, indication for care [O75.9] Intrauterine pregnancy: [redacted]w[redacted]d     Secondary diagnosis: None     Discharge diagnosis: Term Pregnancy Delivered                                                                                                Post partum procedures: NA  Augmentation: N/A  Complications: None  Hospital course:  Onset of Labor With Vaginal Delivery      32 y.o. yo G3P1011 at [redacted]w[redacted]d was admitted in Active Labor on 01/16/2022. Labor course was complicated by NA  Membrane Rupture Time/Date: 6:15 AM ,01/16/2022   Delivery Method:Vaginal, Spontaneous  Episiotomy: None  Lacerations:  None  See delivery note for details  Patient had a postpartum course complicated by NA.  She is tolerating a regular diet, her pain is controlled with PO medications, she is ambulating and voiding without difficulty. She is formula feeding. Her mood is good. Her partner and other family members will be available for PP support. She is ready to go home.   Patient is discharged home in stable condition on 01/17/22.  Newborn Data: Birth date:01/16/2022  Birth time:1:53 PM  Gender:Female    Rebecca Mccann Living status: Living Apgars:7 ,9  Weight:3040 g   Physical exam  Vitals:   01/16/22 1956 01/16/22 2318 01/17/22 0355 01/17/22 0806  BP: 120/78 121/74 105/65 116/79  Pulse: 74 88 74 67  Resp: 16 18 18 20   Temp: 97.9 F (36.6 C) 98.5 F (36.9 C) 98.1 F (36.7 C) 98.2 F (36.8 C)  TempSrc:    Oral  SpO2: 100% 97% 96% 100%  Weight:      Height:       General: alert Perineum: declines exam Lochia: appropriate Uterine Fundus: firm Incision: N/A DVT Evaluation: No evidence of DVT seen on physical exam.  Extremities: No edema   Labs: Lab  Results  Component Value Date   WBC 10.2 01/17/2022   HGB 10.4 (L) 01/17/2022   HCT 30.6 (L) 01/17/2022   MCV 86.9 01/17/2022   PLT 152 01/17/2022    Discharge instruction: per After Visit Summary.  Medications:  Allergies as of 01/17/2022   No Known Allergies      Medication List     STOP taking these medications    multivitamin-prenatal 27-0.8 MG Tabs tablet   terconazole 0.8 % vaginal cream Commonly known as: TERAZOL 3       TAKE these medications    acetaminophen 325 MG tablet Commonly known as: Tylenol Take 2 tablets (650 mg total) by mouth every 4 (four) hours as needed (for pain scale < 4).   ibuprofen 600 MG tablet Commonly known as: ADVIL Take 1 tablet (600 mg total) by mouth every 6 (six) hours as needed.        Diet: routine diet  Activity: Advance as tolerated. Pelvic rest for 6 weeks.  Outpatient follow up:  Follow-up Information     Tresea Mall, CNM. Schedule an appointment as soon as possible for a visit.   Specialty: Obstetrics Why: 2 week telephone visit and 6 week in office postpartum visits (BC patch) Contact information: [address] [place] [address] [phone]                   Postpartum contraception:  Patch Rhogam Given postpartum: Rh positive Rubella vaccine given postpartum: immune Varicella vaccine given postpartum: immune TDaP given antepartum or postpartum: 01/17/2022    Newborn Delivery   Birth date/time: 01/16/2022 13:53:00 Delivery type: Vaginal, Spontaneous       Baby Feeding:  Formula  Disposition:home with mother  SIGNEDEllouise Newer [facility], CNM 01/17/2022 11:16 AM

## 2022-01-16 NOTE — Anesthesia Preprocedure Evaluation (Signed)
Anesthesia Evaluation  Patient identified by MRN, date of birth, ID band Patient awake    History of Anesthesia Complications Negative for: history of anesthetic complications  Airway Mallampati: I  TM Distance: >3 FB Neck ROM: Full    Dental no notable dental hx. (+) Teeth Intact Braces:   Pulmonary neg pulmonary ROS          Cardiovascular Exercise Tolerance: Good negative cardio ROS  Rate:Normal     Neuro/Psych negative neurological ROS  negative psych ROS   GI/Hepatic negative GI ROS, Neg liver ROS,neg GERD  ,,  Endo/Other  negative endocrine ROS    Renal/GU negative Renal ROS  negative genitourinary   Musculoskeletal negative musculoskeletal ROS (+)    Abdominal   Peds  Hematology  (+) Blood dyscrasia, anemia   Anesthesia Other Findings Past Medical History: No date: Medical history non-contributory No date: No known health problems   Reproductive/Obstetrics (+) Pregnancy                             Anesthesia Physical Anesthesia Plan  ASA: 2  Anesthesia Plan: Epidural   Post-op Pain Management:    Induction:   PONV Risk Score and Plan:   Airway Management Planned:   Additional Equipment:   Intra-op Plan:   Post-operative Plan:   Informed Consent: I have reviewed the patients History and Physical, chart, labs and discussed the procedure including the risks, benefits and alternatives for the proposed anesthesia with the patient or authorized representative who has indicated his/her understanding and acceptance.       Plan Discussed with: Anesthesiologist  Anesthesia Plan Comments:         Anesthesia Quick Evaluation

## 2022-01-16 NOTE — Anesthesia Procedure Notes (Signed)
Epidural Patient location during procedure: OB Start time: 01/16/2022 11:56 AM End time: 01/16/2022 12:03 PM  Staffing Anesthesiologist: Martha Clan, MD Performed: anesthesiologist   Preanesthetic Checklist Completed: patient identified, IV checked, site marked, risks and benefits discussed, surgical consent, monitors and equipment checked, pre-op evaluation and timeout performed  Epidural Patient position: sitting Prep: ChloraPrep Patient monitoring: heart rate, continuous pulse ox and blood pressure Approach: midline Location: L3-L4 Injection technique: LOR saline  Needle:  Needle type: Tuohy  Needle gauge: 17 G Needle length: 9 cm Needle insertion depth: 5.5 cm Catheter type: closed end flexible Catheter size: 19 Gauge Catheter at skin depth: 10 cm Test dose: negative and 1.5% lidocaine with Epi 1:200 K  Assessment Sensory level: T10 Events: blood not aspirated, no cerebrospinal fluid, injection not painful, no injection resistance, no paresthesia and negative IV test  Additional Notes 1st attempt Pt. Evaluated and documentation done after procedure finished. Patient identified. Risks/Benefits/Options discussed with patient including but not limited to bleeding, infection, nerve damage, paralysis, failed block, incomplete pain control, headache, blood pressure changes, nausea, vomiting, reactions to medication both or allergic, itching and postpartum back pain. Confirmed with bedside nurse the patient's most recent platelet count. Confirmed with patient that they are not currently taking any anticoagulation, have any bleeding history or any family history of bleeding disorders. Patient expressed understanding and wished to proceed. All questions were answered. Sterile technique was used throughout the entire procedure. Please see nursing notes for vital signs. Test dose was given through epidural catheter and negative prior to continuing to dose epidural or start infusion.  Warning signs of high block given to the patient including shortness of breath, tingling/numbness in hands, complete motor block, or any concerning symptoms with instructions to call for help. Patient was given instructions on fall risk and not to get out of bed. All questions and concerns addressed with instructions to call with any issues or inadequate analgesia.    Patient tolerated the insertion well without immediate complications.Reason for block:procedure for pain

## 2022-01-17 LAB — CBC
HCT: 30.6 % — ABNORMAL LOW (ref 36.0–46.0)
Hemoglobin: 10.4 g/dL — ABNORMAL LOW (ref 12.0–15.0)
MCH: 29.5 pg (ref 26.0–34.0)
MCHC: 34 g/dL (ref 30.0–36.0)
MCV: 86.9 fL (ref 80.0–100.0)
Platelets: 152 10*3/uL (ref 150–400)
RBC: 3.52 MIL/uL — ABNORMAL LOW (ref 3.87–5.11)
RDW: 13.7 % (ref 11.5–15.5)
WBC: 10.2 10*3/uL (ref 4.0–10.5)
nRBC: 0 % (ref 0.0–0.2)

## 2022-01-17 LAB — RPR: RPR Ser Ql: NONREACTIVE

## 2022-01-17 MED ORDER — ACETAMINOPHEN 325 MG PO TABS: 650.0000 mg | ORAL_TABLET | ORAL | Status: DC | PRN

## 2022-01-17 MED ORDER — IBUPROFEN 600 MG PO TABS: 600.0000 mg | ORAL_TABLET | Freq: Four times a day (QID) | ORAL | 0 refills | Status: DC | PRN

## 2022-01-17 NOTE — Clinical Social Work Peds Assess (Signed)
  CLINICAL SOCIAL WORK PEDIATRIC ASSESSMENT NOTE  Patient Details  Name: Elverta Dimiceli MRN: [id number] Date of Birth: 1990-06-02  Date:  01/17/2022  Clinical Social Worker Initiating Note:  Raina Mina Date/Time: Initiated:  01/17/22/      Child's Name:  Rebecca Mccann   Biological Parents:      Need for Interpreter:  None   Reason for Referral:  Other (Comment) (Limited prenatal care)   Address:        Phone number:  [phone]    Household Members:  Parents   Natural Supports (not living in the home):  Spouse/significant other   Professional Supports: None   Employment: Full-time   Type of Work: Advertising copywriter:      Pensions consultant:  Multimedia programmer    Other Resources:      Cultural/Religious Considerations Which May Impact Care:    Strengths:  Ability to meet basic needs     Risk Factors/Current Problems:  None   Cognitive State:  Alert     Mood/Affect:  Calm     CSW Assessment: CSW Assessment: CSW received a consult for MOB due to limited prenatal care CSW spoke with MOB and explained CSW's role and reason for referral. MOB reported she is feeling good post delivery. MOB was alert, appropriate during assessment.  Confirmed contact information for MOB. MOB and Baby will be living with  MOB mother Makinzee Durley and 36 year old daughter at discharge. MOB stated the FOB is [name] who is involved but does not live in the home.  MOB reported she does not have WIC or food stamps but plans on applying.  MOB plans to use [facility]. MOB reported she has a crib, bassinet, car seat (new), clothing, diapers, and all other items needed for Baby. MOB reported she has reliable transportation for herself and Baby. MOB denied resource needs at this time. MOB denied any history of mental health or taking any medications. MOB stated she received prenatal care at the beginning of the pregnancy. MOB stated it was limited at the end  because of work. MOB states she works at [employer]. MOB denied SI, HI, or DV. MOB denied the need for mental health support resources at this time, reported she is aware of resources if needed. CSW provided education and information sheets on PPD and SIDS. MOB verbalized understanding. CSW encouraged MOB to reach out to her Provider with any questions or needs for support or resources, even after discharge. MOB denied any needs or questions at this time. CSW encouraged MOB to reach out if any arise prior to discharge.  CSW Plan/Description:  No Further Intervention Required/No Barriers to Discharge    Raina Mina, [place] 01/17/2022, 10:16 AM

## 2022-01-17 NOTE — Discharge Instructions (Signed)

## 2022-01-17 NOTE — Clinical Social Work Maternal (Signed)
CLINICAL SOCIAL WORK MATERNAL/CHILD NOTE  Patient Details  Name: Rebecca Mccann MRN: [id number] Date of Birth: 06-11-1990  Date:  01/17/2022  Clinical Social Worker Initiating Note:  Raina Mina Date/Time: Initiated:  01/17/22/      Child's Name:  Rebecca Mccann   Biological Parents:  Mother   Need for Interpreter:  None   Reason for Referral:  Other (Comment) (Limited prenatal care)   Address:  [place] [address]    Phone number:  [phone] (home) [phone] (work)    Additional phone number:   Household Members/Support Persons (HM/SP):   Household Member/Support Person 1   HM/SP Name Relationship DOB or Age  HM/SP -[place] Mother Daughter, 32 year old  HM/SP -2        HM/SP -3        HM/SP -4        HM/SP -5        HM/SP -6        HM/SP -7        HM/SP -8          Natural Supports (not living in the home):  Spouse/significant other   Professional Supports: None   Employment: Full-time   Type of Work: Advertising copywriter:      Homebound arranged:    Museum/gallery curator Resources:  Multimedia programmer    Other Resources:      Cultural/Religious Considerations Which May Impact Care:  None  Strengths:  Ability to meet basic needs     Psychotropic Medications:         Pediatrician:     Hydrographic surveyor List:   [place]      Pediatrician Fax Number:    Risk Factors/Current Problems:  None   Cognitive State:  Alert     Mood/Affect:  Calm     CSW Assessment: CSW Assessment: CSW received a consult for MOB due to limited prenatal care CSW spoke with MOB and explained CSW's role and reason for referral. MOB reported she is feeling good post delivery. MOB was alert, appropriate during assessment.  Confirmed contact information for MOB. MOB and Baby will be living with  MOB mother Rebecca Mccann and 32 year old daughter  at discharge. MOB stated the FOB is [name] who is involved but does not live in the home.  MOB reported she does not have WIC or food stamps but plans on applying.  MOB plans to use [facility]. MOB reported she has a crib, bassinet, car seat (new), clothing, diapers, and all other items needed for Baby. MOB reported she has reliable transportation for herself and Baby. MOB denied resource needs at this time. MOB denied any history of mental health or taking any medications. MOB stated she received prenatal care at the beginning of the pregnancy. MOB stated it was limited at the end because of work. MOB states she works at [employer]. MOB denied SI, HI, or DV. MOB denied the need for mental health support resources at this time, reported she is aware of resources if needed. CSW provided education and information sheets on PPD and SIDS. MOB verbalized understanding. CSW encouraged MOB to reach out to her Provider with any questions or needs for support or resources, even after discharge. MOB denied any needs or questions  at this time. CSW encouraged MOB to reach out if any arise prior to discharge.  CSW Plan/Description:  No Further Intervention Required/No Barriers to Discharge    Susa Simmonds, LCSWA 01/17/2022, 10:15 AM

## 2022-01-17 NOTE — Progress Notes (Signed)
Obstetric Postpartum Daily Progress Note Subjective:  32 y.o. [id number] postpartum day #1 status post vaginal delivery.  She is ambulating, is tolerating po, is voiding spontaneously.  Her pain is well controlled on PO pain medications. Her lochia is less than menses. She is formula feeding. Her mood has been good. Her partner and other family members will be available for PP support.    Medications SCHEDULED MEDICATIONS   ibuprofen  600 mg Oral Q6H   prenatal multivitamin  1 tablet Oral Q1200   senna-docusate  2 tablet Oral Daily    MEDICATION INFUSIONS    PRN MEDICATIONS  acetaminophen, benzocaine-Menthol, coconut oil, witch hazel-glycerin **AND** dibucaine, diphenhydrAMINE, ondansetron **OR** ondansetron (ZOFRAN) IV, simethicone    Objective:   Vitals:   01/16/22 1956 01/16/22 2318 01/17/22 0355 01/17/22 0806  BP: 120/78 121/74 105/65 116/79  Pulse: 74 88 74 67  Resp: 16 18 18 20   Temp: 97.9 F (36.6 C) 98.5 F (36.9 C) 98.1 F (36.7 C) 98.2 F (36.8 C)  TempSrc:    Oral  SpO2: 100% 97% 96% 100%  Weight:      Height:        Current Vital Signs 24h Vital Sign Ranges  T 98.2 F (36.8 C) Temp  Avg: 98.1 F (36.7 C)  Min: 97.6 F (36.4 C)  Max: 98.5 F (36.9 C)  BP 116/79 BP  Min: 105/65  Max: 130/81  HR 67 Pulse  Avg: 74.1  Min: 60  Max: 97  RR 20 Resp  Avg: 18  Min: 16  Max: 20  SaO2 100 % Room Air SpO2  Avg: 99.3 %  Min: 96 %  Max: 100 %       24 Hour I/O Current Shift I/O  Time Ins Outs 01/06 0701 - 01/07 0700 In: 229.4 [I.V.:229.4] Out: 200  No intake/output data recorded.  General: NAD Pulmonary: no increased work of breathing Abdomen: non-distended, non-tender, fundus firm at level of umbilicus Perineum: declines exam  Extremities: no edema, no erythema, no tenderness  Labs:  Recent Labs  Lab 01/16/22 1051 01/17/22 0519  WBC 5.6 10.2  HGB 12.9 10.4*  HCT 39.0 30.6*  PLT 173 152     Assessment:   32 y.o. [id number] postpartum day # 1 status post  SVB  Plan:   1) Acute blood loss anemia - hemodynamically stable and asymptomatic - po ferrous sulfate  2) A POS / Rubella 1.80 (08/30 1010)/ Varicella Immune  3) TDAP status given 01/17/2022  4) bottle /Contraception =  patch  5) Disposition discharge later today after all 24 hours cares complete   Jillene Bucks [facility], CNM  01/17/2022 11:10 AM

## 2022-01-17 NOTE — Progress Notes (Signed)
Patient discharged home with family.  Discharge instructions, when to follow up, and prescriptions reviewed with patient.  Patient verbalized understanding. Patient will be escorted out by auxiliary.   

## 2022-01-17 NOTE — Anesthesia Postprocedure Evaluation (Signed)
Anesthesia Post Note  Patient: Rebecca Mccann  Procedure(s) Performed: AN AD [place]  Patient location during evaluation: Mother Baby Anesthesia Type: Epidural Level of consciousness: awake and alert Pain management: pain level controlled Vital Signs Assessment: post-procedure vital signs reviewed and stable Respiratory status: spontaneous breathing, nonlabored ventilation and respiratory function stable Cardiovascular status: stable Postop Assessment: no headache, no backache, epidural receding and able to ambulate Anesthetic complications: no   No notable events documented.   Last Vitals:  Vitals:   01/17/22 0355 01/17/22 0806  BP: 105/65 116/79  Pulse: 74 67  Resp: 18 20  Temp: 36.7 C 36.8 C  SpO2: 96% 100%    Last Pain:  Vitals:   01/17/22 0930  TempSrc:   PainSc: 2                  Darrin Nipper

## 2022-01-20 LAB — TYPE AND SCREEN
ABO/RH(D): A POS
Antibody Screen: POSITIVE
Unit division: 0
Unit division: 0

## 2022-01-20 LAB — BPAM RBC
Blood Product Expiration Date: 202402022359
Blood Product Expiration Date: 202402022359
Unit Type and Rh: 6200
Unit Type and Rh: 6200

## 2022-01-29 ENCOUNTER — Telehealth (INDEPENDENT_AMBULATORY_CARE_PROVIDER_SITE_OTHER): Payer: Commercial Managed Care - PPO | Admitting: Advanced Practice Midwife

## 2022-01-29 ENCOUNTER — Encounter: Payer: Self-pay | Admitting: Advanced Practice Midwife

## 2022-01-29 DIAGNOSIS — Z30016 Encounter for initial prescription of transdermal patch hormonal contraceptive device: Secondary | ICD-10-CM

## 2022-01-29 MED ORDER — NORELGESTROMIN-ETH ESTRADIOL 150-35 MCG/24HR TD PTWK: 1.0000 | MEDICATED_PATCH | TRANSDERMAL | 12 refills | Status: DC

## 2022-01-29 NOTE — Progress Notes (Signed)
Patient ID: Rebecca Mccann, female   DOB: [birth year], 32 y.o.   MRN: [id number]  Reason for Consult: Postpartum Care (Patient reports that she feel well today, she is bottle feeding using Similac. Patient states that baby is well no urinary or bowel concerns for either baby or mom. Patient denies vaginal bleeding. )   Subjective:  HPI:  Rebecca Mccann is a 32 y.o. female 2 weeks postpartum telephone visit. She reports she and baby are doing well at this time. No problems with breast, bowel, bladder. Her mood is good and she has a good support system. Her bleeding stopped a few days ago. Currently having some clear discharge. Advised not to start birth control patch for at least 2 weeks and to be sure to have birth control in place prior to intercourse.  Past Medical History:  Diagnosis Date   Medical history non-contributory    No known health problems    Family History  Problem Relation Age of Onset   Hypertension Mother    Cancer Maternal Aunt 60       breast   Past Surgical History:  Procedure Laterality Date   NO PAST SURGERIES     WISDOM TOOTH EXTRACTION     four; 2020    Short Social History:  Social History   Tobacco Use   Smoking status: Never   Smokeless tobacco: Never  Substance Use Topics   Alcohol use: Not Currently    No Known Allergies  Current Outpatient Medications  Medication Sig Dispense Refill   acetaminophen (TYLENOL) 325 MG tablet Take 2 tablets (650 mg total) by mouth every 4 (four) hours as needed (for pain scale < 4).     ibuprofen (ADVIL) 600 MG tablet Take 1 tablet (600 mg total) by mouth every 6 (six) hours as needed. 30 tablet 0   [START ON 02/14/2022] norelgestromin-ethinyl estradiol Rebecca Mccann) 150-35 MCG/24HR transdermal patch Place 1 patch onto the skin once a week. 3 patch 12   No current facility-administered medications for this visit.    Review of Systems  Constitutional:  Negative for chills and fever.  HENT:  Negative for  congestion, ear discharge, ear pain, hearing loss, sinus pain and sore throat.   Eyes:  Negative for blurred vision and double vision.  Respiratory:  Negative for cough, shortness of breath and wheezing.   Cardiovascular:  Negative for chest pain, palpitations and leg swelling.  Gastrointestinal:  Negative for abdominal pain, blood in stool, constipation, diarrhea, heartburn, melena, nausea and vomiting.  Genitourinary:  Negative for dysuria, flank pain, frequency, hematuria and urgency.  Musculoskeletal:  Negative for back pain, joint pain and myalgias.  Skin:  Negative for itching and rash.  Neurological:  Negative for dizziness, tingling, tremors, sensory change, speech change, focal weakness, seizures, loss of consciousness, weakness and headaches.  Endo/Heme/Allergies:  Negative for environmental allergies. Does not bruise/bleed easily.  Psychiatric/Behavioral:  Negative for depression, hallucinations, memory loss, substance abuse and suicidal ideas. The patient is not nervous/anxious and does not have insomnia.         Objective:  Objective   There were no vitals filed for this visit. There is no height or weight on file to calculate BMI.  Assessment/Plan:     32 y.o. G3 P27 female telephone visit at 2 weeks postpartum. No concerns.  Return to office in 4 weeks for 6 week postpartum visit Rx Xulane birth control patch to start no sooner than 2 weeks from now   [facility] 01/29/2022, 10:46 AM      Virtual Visit via Telephone Note  I connected with Rebecca Mccann on 01/29/22 at  10:30 AM EST by telephone and verified that I am speaking with the correct person using two identifiers.   I discussed the limitations, risks, security and privacy concerns of performing an evaluation and management service by telephone and the availability of in person appointments. I also discussed with the patient that there may be a patient  responsible charge related to this service. The patient expressed understanding and agreed to proceed.  The patient was at home I spoke with the patient from my  office phone The names of people involved in this encounter were: Adena Reade and myself Rod Can, CNM

## 2022-01-29 NOTE — Progress Notes (Signed)
Edinburgh Postnatal Depression Scale - 01/29/22 1005       Edinburgh Postnatal Depression Scale:  In the Past 7 Days   I have been able to laugh and see the funny side of things. 0    I have looked forward with enjoyment to things. 0    I have blamed myself unnecessarily when things went wrong. 0    I have been anxious or worried for no good reason. 1    I have felt scared or panicky for no good reason. 1    Things have been getting on top of me. 0    I have been so unhappy that I have had difficulty sleeping. 0    I have felt sad or miserable. 0    I have been so unhappy that I have been crying. 1    The thought of harming myself has occurred to me. 0    Edinburgh Postnatal Depression Scale Total 3

## 2022-03-01 ENCOUNTER — Ambulatory Visit: Payer: Commercial Managed Care - PPO | Admitting: Advanced Practice Midwife

## 2022-03-01 ENCOUNTER — Telehealth: Payer: Self-pay | Admitting: Advanced Practice Midwife

## 2022-03-01 NOTE — Telephone Encounter (Signed)
Reached out to pt to reschedule ROB appt that was scheduled with JEG on 03/01/2022  at 10:15.  (JEG is sick.)  Left message for pt to call back.

## 2022-03-02 NOTE — Telephone Encounter (Signed)
Pt's 6 week PP visit was rescheduled to 03/10/2022 at 2:55 with JEG.

## 2022-03-10 ENCOUNTER — Encounter: Payer: Self-pay | Admitting: Advanced Practice Midwife

## 2022-03-10 ENCOUNTER — Ambulatory Visit (INDEPENDENT_AMBULATORY_CARE_PROVIDER_SITE_OTHER): Payer: Commercial Managed Care - PPO | Admitting: Advanced Practice Midwife

## 2022-03-10 ENCOUNTER — Telehealth: Payer: Self-pay

## 2022-03-10 NOTE — Telephone Encounter (Signed)
[facility]- Discharge Call Backs-left a VM for the pt about the following below. 1-Do you have any questions or concerns about yourself as you heal? 2-Any concerns or questions about your baby? Is your baby eating, peeing,pooping well? 3-How was your stay at the hospital? 5-How did our team work together to care for you? You should be receiving a survey in the mail soon.   We would really appreciate it if you could fill that out for [place] and return it in the mail.  We value the feedback to make improvements and continue the great work we do.   If you have any questions please feel free to call me back at [phone]

## 2022-03-10 NOTE — Progress Notes (Signed)
[facility]   Postpartum Visit  Chief Complaint:  Chief Complaint  Patient presents with   Postpartum Care    History of Present Illness: Patient is a 32 y.o. [id number] presents for postpartum visit.  Review the Delivery Report for details.   Date of delivery: 01/16/2022 Type of delivery: Vaginal delivery - Vacuum or forceps assisted  no Episiotomy No.  Laceration: no  Pregnancy or labor problems:  no Any problems since the delivery:  no  Newborn Details:  SINGLETON :  1. BabyGender female. Birth weight: 6 pounds 11 ounces Maternal Details:  Breast or formula feeding: formula feeding Intercourse: Yes  Contraception after delivery: Yes  Transdermal Patch Any bowel or bladder issues: No  Post partum depression/anxiety noted:  no Edinburgh Post-Partum Depression Score: 4 Date of last PAP: December 2021  no abnormalities   Review of Systems: Review of Systems  Constitutional:  Negative for chills and fever.  HENT:  Negative for congestion, ear discharge, ear pain, hearing loss, sinus pain and sore throat.   Eyes:  Negative for blurred vision and double vision.  Respiratory:  Negative for cough, shortness of breath and wheezing.   Cardiovascular:  Negative for chest pain, palpitations and leg swelling.  Gastrointestinal:  Negative for abdominal pain, blood in stool, constipation, diarrhea, heartburn, melena, nausea and vomiting.  Genitourinary:  Negative for dysuria, flank pain, frequency, hematuria and urgency.  Musculoskeletal:  Negative for back pain, joint pain and myalgias.  Skin:  Negative for itching and rash.  Neurological:  Negative for dizziness, tingling, tremors, sensory change, speech change, focal weakness, seizures, loss of consciousness, weakness and headaches.  Endo/Heme/Allergies:  Negative for environmental allergies. Does not bruise/bleed easily.  Psychiatric/Behavioral:  Negative for depression, hallucinations, memory loss, substance abuse and suicidal  ideas. The patient is not nervous/anxious and does not have insomnia.      Past Medical History:  Past Medical History:  Diagnosis Date   Medical history non-contributory    No known health problems     Past Surgical History:  Past Surgical History:  Procedure Laterality Date   NO PAST SURGERIES     WISDOM TOOTH EXTRACTION     four; 2020    Family History:  Family History  Problem Relation Age of Onset   Hypertension Mother    Cancer Maternal Aunt 1       breast    Social History:  Social History   Socioeconomic History   Marital status: Significant Other    Spouse name: Alex   Number of children: 1   Years of education: 12   Highest education level: Not on file  Occupational History   Occupation: Armed forces technical officer  Tobacco Use   Smoking status: Never   Smokeless tobacco: Never  Vaping Use   Vaping Use: Never used  Substance and Sexual Activity   Alcohol use: Not Currently   Drug use: Never   Sexual activity: Yes    Partners: Male    Birth control/protection: Patch  Other Topics Concern   Not on file  Social History Narrative   Not on file   Social Determinants of Health   Financial Resource Strain: Low Risk  (09/03/2021)   Overall Financial Resource Strain (CARDIA)    Difficulty of Paying Living Expenses: Not very hard  Food Insecurity: No Food Insecurity (01/16/2022)   Hunger Vital Sign    Worried About Running Out of Food in the Last Year: Never true    Ran Out of Food in  the Last Year: Never true  Transportation Needs: No Transportation Needs (01/16/2022)   PRAPARE - Hydrologist (Medical): No    Lack of Transportation (Non-Medical): No  Physical Activity: Insufficiently Active (09/03/2021)   Exercise Vital Sign    Days of Exercise per Week: 1 day    Minutes of Exercise per Session: 60 min  Stress: No Stress Concern Present (09/03/2021)   [place]     Feeling of Stress : Not at all  Social Connections: Unknown (09/03/2021)   Social Connection and Isolation Panel [NHANES]    Frequency of Communication with Friends and Family: Twice a week    Frequency of Social Gatherings with Friends and Family: Once a week    Attends Religious Services: 1 to 4 times per year    Active Member of [employer] or Organizations: No    Attends Archivist Meetings: Never    Marital Status: Not on file  Intimate Partner Violence: Not At Risk (09/03/2021)   Humiliation, Afraid, Rape, and Kick questionnaire    Fear of Current or Ex-Partner: No    Emotionally Abused: No    Physically Abused: No    Sexually Abused: No    Allergies:  No Known Allergies  Medications: Prior to Admission medications   Medication Sig Start Date End Date Taking? Authorizing Provider  norelgestromin-ethinyl estradiol [name]) 150-35 MCG/24HR transdermal patch Place 1 patch onto the skin once a week. 02/14/22  Yes Rod Can, CNM  acetaminophen (TYLENOL) 325 MG tablet Take 2 tablets (650 mg total) by mouth every 4 (four) hours as needed (for pain scale < 4). Patient not taking: Reported on 03/10/2022 01/17/22   DominicNunzio Cobbs, CNM  ibuprofen (ADVIL) 600 MG tablet Take 1 tablet (600 mg total) by mouth every 6 (six) hours as needed. Patient not taking: Reported on 03/10/2022 01/17/22   Allen Derry, CNM    Physical Exam Blood pressure 120/80, height '5\' 4"'$  (1.626 m), weight 161 lb (73 kg), last menstrual period 03/01/2022, not currently breastfeeding.    General: NAD HEENT: normocephalic, anicteric Pulmonary: No increased work of breathing Abdomen: NABS, soft, non-tender, non-distended.  Umbilicus without lesions.  No hepatomegaly, splenomegaly or masses palpable. No evidence of hernia. Genitourinary:  External: Normal external female genitalia.  Normal urethral meatus, normal Bartholin's and Skene's glands.    Vagina: Normal vaginal mucosa, no evidence of prolapse.     Cervix: Grossly normal in appearance, no bleeding  Uterus: Non-enlarged, mobile, normal contour.  No CMT  Adnexa: ovaries non-enlarged, no adnexal masses  Rectal: deferred Extremities: no edema, erythema, or tenderness Neurologic: Grossly intact Psychiatric: mood appropriate, affect full   Edinburgh Postnatal Depression Scale - 03/10/22 1556       Edinburgh Postnatal Depression Scale:  In the Past 7 Days   I have been able to laugh and see the funny side of things. 0    I have looked forward with enjoyment to things. 1    I have blamed myself unnecessarily when things went wrong. 1    I have been anxious or worried for no good reason. 0    I have felt scared or panicky for no good reason. 1    Things have been getting on top of me. 1    I have been so unhappy that I have had difficulty sleeping. 0    I have felt sad or miserable. 0    I have  been so unhappy that I have been crying. 0    The thought of harming myself has occurred to me. 0    Edinburgh Postnatal Depression Scale Total 4             Assessment: 32 y.o. [id number] presenting for 6 week postpartum visit  Plan: Problem List Items Addressed This Visit   None Visit Diagnoses     6 weeks postpartum follow-up    -  Primary        1) Contraception - Education given regarding options for contraception, as well as compatibility with breast feeding if applicable.  Patient plans on Xulane patches weekly for contraception.  2)  Pap - ASCCP guidelines and rationale discussed.  Patient opts for every 3 years screening interval. Will be due at annual March 2025  3) Patient underwent screening for postpartum depression with no signs of depression  4) Return in about 1 year (around 03/11/2023) for annual established gyn/PAP due.   Rod Can, [facility] 03/10/2022, 5:23 PM

## 2022-05-21 ENCOUNTER — Ambulatory Visit: Payer: Commercial Managed Care - PPO | Admitting: Advanced Practice Midwife

## 2022-05-24 ENCOUNTER — Ambulatory Visit (INDEPENDENT_AMBULATORY_CARE_PROVIDER_SITE_OTHER): Payer: Commercial Managed Care - PPO | Admitting: Obstetrics

## 2022-05-24 ENCOUNTER — Encounter: Payer: Self-pay | Admitting: Obstetrics

## 2022-05-24 VITALS — BP 115/74 | HR 83 | Ht 65.0 in | Wt 156.0 lb

## 2022-05-24 DIAGNOSIS — O9089 Other complications of the puerperium, not elsewhere classified: Secondary | ICD-10-CM

## 2022-05-24 DIAGNOSIS — N898 Other specified noninflammatory disorders of vagina: Secondary | ICD-10-CM

## 2022-05-24 MED ORDER — ESTRADIOL 0.1 MG/GM VA CREA: 1.0000 | TOPICAL_CREAM | Freq: Every day | VAGINAL | 12 refills | Status: AC

## 2022-05-24 NOTE — Progress Notes (Signed)
GYN ENCOUNTER  Subjective  HPI: Akansha Harrower is a 32 y.o. [id number] who presents today for assessment of the healing of her perineal abrasion. She had a NSVB on 01/16/22 with a hemostatic perineal abrasion that did not require repair. She reports that she had minimal pain during the postpartum period. Ahe resumed intercourse at approximately 6 weeks postpartum, and she has had pain in the area of her abrasion since then. She describes it as a sharp pain with penetration and a feeling of irritation. She does not use lubrication with intercourse. She denies lesions, discharge, and odor. She reports that she yeast infection symptoms but they resolved with OTC treatment. She is not breastfeeding. She is currently on her menses.  Past Medical History:  Diagnosis Date   Medical history non-contributory    No known health problems    Past Surgical History:  Procedure Laterality Date   NO PAST SURGERIES     WISDOM TOOTH EXTRACTION     four; 2020   OB History     Gravida  3   Para  2   Term  2   Preterm      AB  1   Living  2      SAB      IAB  1   Ectopic      Multiple  0   Live Births  2          No Known Allergies  ROS: Pertinent items noted in HPI  Objective  BP 115/74   Pulse 83   Ht 5\' 5"  (1.651 m)   Wt 156 lb (70.8 kg)   LMP 05/22/2022   Breastfeeding No   BMI 25.96 kg/m   Physical examination   Pelvic:   Vulva: Normal appearance.  No lesions.  Vagina: No lesions or abnormalities noted. Area of irritation that is tender to palpation on right labia minora.   Support: Normal pelvic support.  Urethra No masses tenderness or scarring.  Meatus Normal size without lesions or prolapse.  Perineum: Normal exam.  No lesions.    Assessment  Poor healing of labial abrasion  Plan  Rx for estrogen cream to the area of irritation Recommend abstention from intercourse for 2 weeks or until abrasion is fully healed Encourage use of lubrication when resuming  intercourse. Samples of [employer] lube given.  RTC for annual exam or if no improvement.  Guadlupe Spanish, CNM

## 2023-11-24 ENCOUNTER — Other Ambulatory Visit: Payer: Self-pay

## 2023-11-24 ENCOUNTER — Encounter: Payer: Self-pay | Admitting: *Deleted

## 2023-11-24 ENCOUNTER — Emergency Department
Admission: EM | Admit: 2023-11-24 | Discharge: 2023-11-24 | Disposition: A | Discharge status: Home / Self Care | Attending: Emergency Medicine | Admitting: Emergency Medicine

## 2023-11-24 DIAGNOSIS — N898 Other specified noninflammatory disorders of vagina: Secondary | ICD-10-CM | POA: Diagnosis present

## 2023-11-24 DIAGNOSIS — R1032 Left lower quadrant pain: Secondary | ICD-10-CM | POA: Insufficient documentation

## 2023-11-24 DIAGNOSIS — Z202 Contact with and (suspected) exposure to infections with a predominantly sexual mode of transmission: Secondary | ICD-10-CM

## 2023-11-24 LAB — CHLAMYDIA/NGC RT PCR (ARMC ONLY)
Chlamydia Tr: NOT DETECTED
N gonorrhoeae: NOT DETECTED

## 2023-11-24 NOTE — ED Provider Notes (Signed)
 [facility] Provider Note    Event Date/Time   First MD Initiated Contact with Patient 11/24/23 1848     (approximate)   History   SEXUALLY TRANSMITTED DISEASE    HPI  Rebecca Mccann is a 33 y.o. female    with a past medical history of yeast vaginitis, vaginal irritation,  who presents to the ED complaining of partner was positive for STD. According to the patient, her partner was diagnosed with NGU (no gonorrhea urethritis).  Patient endorses having normal discharge, the finding normal as a white color, no pruritus, no smell.  Patient reports having occasional left lower quadrant pain.  Her cycles are irregular.  Patient denies urinary symptoms.  Patient is here by herself     Patient Active Problem List   Diagnosis Date Noted   Postpartum care following vaginal delivery 01/16/2022   Supervision of other normal pregnancy, antepartum 09/03/2021     Physical Exam   Triage Vital Signs: ED Triage Vitals  Encounter Vitals Group     BP 11/24/23 1743 (!) 118/106     Girls Systolic BP Percentile --      Girls Diastolic BP Percentile --      Boys Systolic BP Percentile --      Boys Diastolic BP Percentile --      Pulse Rate 11/24/23 1743 89     Resp 11/24/23 1743 20     Temp 11/24/23 1743 98.5 F (36.9 C)     Temp Source 11/24/23 1743 Oral     SpO2 11/24/23 1743 99 %     Weight 11/24/23 1744 160 lb (72.6 kg)     Height 11/24/23 1744 5' 5 (1.651 m)     Head Circumference --      Peak Flow --      Pain Score 11/24/23 1744 0     Pain Loc --      Pain Education --      Exclude from Growth Chart --     Most recent vital signs: Vitals:   11/24/23 1743  BP: (!) 118/106  Pulse: 89  Resp: 20  Temp: 98.5 F (36.9 C)  SpO2: 99%     Physical Exam Vitals and nursing note reviewed.  During triage patient was hypertensive  General:          Awake, no distress.  CV:                  Good peripheral perfusion.  Resp:               Normal  effort. no tachypnea Abd:                 No distention.  Soft nontender Other:               ED Results / Procedures / Treatments   Labs (all labs ordered are listed, but only abnormal results are displayed) Labs Reviewed  CHLAMYDIA/NGC RT PCR ([facility] ONLY)            HIV ANTIBODY (ROUTINE TESTING W REFLEX)     PROCEDURES:  Critical Care performed:   Procedures   MEDICATIONS ORDERED IN ED: Medications - No data to display    IMPRESSION / MDM / ASSESSMENT AND PLAN / ED COURSE  I reviewed the triage vital signs and the nursing notes.  Differential diagnosis includes, but is not limited to, chlamydia, gonorrhea, vaginal candidiasis  Patient's presentation is most consistent with acute  complicated illness / injury requiring diagnostic workup.   Rebecca Mccann is a 33 y.o., female presents today after learning that her partner was diagnosed with no gonorrheal urethritis.  Patient has vaginal discharge, normal for her, white, no malodorous, no vaginal pruritus.  This kind of discharge is not permanent.  Patient endorses having sometimes left lower quadrant abdominal pain but not today.  On a physical exam patient was afebrile, vital signs were normal.  Abdomen bowel sounds positive, soft nontender to palpation. I did explain to the patient about his partner diagnosis.  I offered chlamydia and gonorrhea test for her.  Patient desires to rule out that possibility.  Patient wants to be discharged, she will check results via MyChart.  I did advise patient if results are positive she will need to come back for treatment. Patient's diagnosis is consistent with possible exposure to STD.  Labs are pending.. I did review the patient's allergies and medications.The patient is in stable and satisfactory condition for discharge home  Patient will be discharged home without prescriptions. Patient is to follow up with PCP as needed or otherwise directed. Patient is given ED precautions to return to the  ED for any worsening or new symptoms.  Patient does not have a PCP I will refer patient to [facility] clinic Discussed plan of care with patient, answered all of patient's questions, Patient agreeable to plan of care. Patient verbalized understanding.  FINAL CLINICAL IMPRESSION(S) / ED DIAGNOSES   Final diagnoses:  Possible exposure to STD     Rx / DC Orders   ED Discharge Orders     None        Note:  This document was prepared using Dragon voice recognition software and may include unintentional dictation errors.   Janit Kast, PA-C 11/24/23 2016    Claudene Rover, MD 11/25/23 [phone]

## 2023-11-24 NOTE — Discharge Instructions (Addendum)
 You have been diagnosed with possible exposure to STD.  Please check on MyChart the results of chlamydia and gonorrhea test.  Please come back to ED if the results are positive for treatment.  Please call [facility] clinic and make an appointment to establish care.  Please come back to ED or go to your PCP if you have new symptoms symptoms worsen.

## 2023-11-24 NOTE — ED Triage Notes (Signed)
 Pt ambulatory to triage.  Pt states her partner was positive for STD.  Pt requesting to be treated .  Pt alert.   Pt denies vaginal discharge, bleeding or urinary sx.  Pt alert.

## 2023-11-24 NOTE — ED Notes (Signed)
 PT in no acute distress prior to discharge. Discharged instructions reviewed, all questions answered and pt verbalized understanding at this time. Pt has all belongings with them at time of discharge.

## 2023-11-25 LAB — HIV ANTIBODY (ROUTINE TESTING W REFLEX): HIV Screen 4th Generation wRfx: NONREACTIVE

## 2023-11-29 ENCOUNTER — Ambulatory Visit: Admitting: Family Medicine

## 2023-11-29 DIAGNOSIS — N898 Other specified noninflammatory disorders of vagina: Secondary | ICD-10-CM

## 2023-11-29 DIAGNOSIS — Z3201 Encounter for pregnancy test, result positive: Secondary | ICD-10-CM | POA: Insufficient documentation

## 2023-11-29 DIAGNOSIS — N341 Nonspecific urethritis: Secondary | ICD-10-CM

## 2023-11-29 DIAGNOSIS — Z30011 Encounter for initial prescription of contraceptive pills: Secondary | ICD-10-CM | POA: Insufficient documentation

## 2023-11-29 DIAGNOSIS — B379 Candidiasis, unspecified: Secondary | ICD-10-CM

## 2023-11-29 LAB — WET PREP FOR TRICH, YEAST, CLUE
Clue Cell Exam: NEGATIVE
Trichomonas Exam: NEGATIVE

## 2023-11-29 LAB — PREGNANCY, URINE: Preg Test, Ur: POSITIVE — AB

## 2023-11-29 MED ORDER — CLOTRIMAZOLE 1 % VA CREA: 1.0000 | TOPICAL_CREAM | Freq: Every day | VAGINAL | 0 refills | Status: AC

## 2023-11-29 MED ORDER — AZITHROMYCIN 500 MG PO TABS: 1000.0000 mg | ORAL_TABLET | Freq: Once | ORAL | 0 refills | Status: AC

## 2023-11-29 MED ORDER — AZITHROMYCIN 500 MG PO TABS: 1000.0000 mg | ORAL_TABLET | Freq: Once | ORAL | 0 refills | Status: DC

## 2023-11-29 MED ORDER — NORETHINDRONE 0.35 MG PO TABS: 1.0000 | ORAL_TABLET | Freq: Every day | ORAL | 3 refills | Status: AC

## 2023-11-29 NOTE — Progress Notes (Signed)
 [facility] [address] [place] [address] Main phone: [phone]  Family Planning Visit - Initial Visit  Subjective:  Rebecca Mccann is a 33 y.o.  [id number]   being seen today for an initial annual visit and to discuss reproductive life planning.    Patient reports they are not pregnant and not breastfeeding. They do not desire a pregnancy in the next year. Patient is currently using no method - no contraceptive precautions to prevent pregnancy (out of her birth control pills for the last six months). They reported they are interested in discussing contraception today.    No LMP recorded (lmp unknown). (Menstrual status: Irregular Periods). LMP early October. Irregular periods.   Patient reports desiring treatment because she is a contact to NGU. She reports 2 days now of vaginal pruritus and irritation. No sores, rashes, dysuria, lymphadenopathy. Has not tried any OTC treatments yet.   Desires refill of birth control. Cannot remember name. No contraindications to estrogen: no history of HTN, DVT, PE, stroke, migraine with aura, congenital clotting disorders, or smoking.  Patient reports they are looking for a method with the following characteristics:  Cycle control High efficacy at preventing pregnancy  Patient has the following medical conditions: Patient Active Problem List   Diagnosis Date Noted   Contact to non-gonococcal urethritis (NGU) 11/29/2023   Vaginal irritation 11/29/2023   Encounter for initial prescription of contraceptive pills 11/29/2023   Positive pregnancy test 11/29/2023   Postpartum care following vaginal delivery 01/16/2022   Supervision of other normal pregnancy, antepartum 09/03/2021   No chief complaint on file.  Review of Systems  Constitutional:  Negative for fever, malaise/fatigue and weight loss.  Respiratory:  Negative for shortness of breath.   Cardiovascular:   Negative for chest pain and palpitations.   Diabetes screening This patient is 33 y.o. with a BMI of There is no height or weight on file to calculate BMI..  Is patient eligible for diabetes screening (age >35 and BMI >25)?  no  Was Hgb A1c ordered? not applicable  STI screening Patient reports 1 of partners in last year.  Does this patient desire STI screening?  Yes  Hepatitis C screening Has patient been screened once for HCV in the past?  No  No results found for: HCVAB  Does the patient meet criteria for HCV testing? Yes  Hepatitis B screening Does the patient meet criteria for HBV testing? Yes  Cervical Cancer Screening  Result Date Procedure Results Follow-ups  01/07/2020 Cytology - PAP Neisseria Gonorrhea: Negative Chlamydia: Negative Adequacy: Satisfactory for evaluation. Diagnosis: - Negative for intraepithelial lesion or malignancy (NILM) Comment: Normal Reference Ranger Chlamydia - Negative Comment: Normal Reference Range Neisseria Gonorrhea - Negative    Health Maintenance Due  Topic Date Due   Hepatitis B Vaccines 19-59 Average Risk (1 of 3 - 19+ 3-dose series) Never done   HPV VACCINES (1 - 3-dose SCDM series) Never done   Cervical Cancer Screening (HPV/Pap Cotest)  01/07/2023   Influenza Vaccine  08/12/2023   COVID-19 Vaccine (1 - 2025-26 season) Never done   The following portions of the patient's history were reviewed and updated as appropriate: allergies, current medications, past family history, past medical history, past social history, past surgical history and problem list. Problem list updated.  See flowsheet for further details and programmatic requirements Hyperlink available at the top of the signed note in blue.  Flow sheet content below:    Objective:  There were no vitals filed for this visit.  Physical Exam Vitals and nursing note reviewed.  Constitutional:      Appearance: Normal appearance.  HENT:     Head: Normocephalic.  Eyes:      General: No scleral icterus.       Right eye: No discharge.        Left eye: No discharge.  Pulmonary:     Effort: Pulmonary effort is normal.  Genitourinary:    Comments: Declined exam, self swabbed Musculoskeletal:        General: Normal range of motion.     Cervical back: Neck supple. No rigidity or tenderness.  Lymphadenopathy:     Cervical: No cervical adenopathy.     Right cervical: No superficial or posterior cervical adenopathy.    Left cervical: No superficial or posterior cervical adenopathy.     Upper Body:     Right upper body: No supraclavicular adenopathy.     Left upper body: No supraclavicular adenopathy.  Skin:    General: Skin is warm and dry.     Coloration: Skin is not jaundiced or pale.     Findings: No bruising, erythema, lesion or rash.  Neurological:     Mental Status: She is alert. Mental status is at baseline.  Psychiatric:        Mood and Affect: Mood normal.        Behavior: Behavior normal.    Assessment and Plan:  [name] is a 33 y.o. female presenting to the [facility] Department for an initial annual wellness/contraceptive visit  Contraception counseling:  Reviewed options based on patient desire and reproductive life plan. Patient is interested in Oral Contraceptive. This was provided to the patient today.   Risks, benefits, and typical effectiveness rates were reviewed.  Questions were answered.  Written information was also given to the patient to review.    The patient will follow up in  1 years for surveillance.  The patient was told to call with any further questions, or with any concerns about this method of contraception.  Emphasized use of condoms 100% of the time for STI prevention.  Emergency Contraception Precautions (ECP): Patient assessed for need of ECP. She is not a candidate based on report of unprotected sex more than 120 hours ago (5 days).   Contact to non-gonococcal urethritis (NGU) Assessment  & Plan: Given positive pregnancy test today, will treat with azithromycin 1,000 mg once.   Orders: -     Pregnancy, urine -     Azithromycin; Take 2 tablets (1,000 mg total) by mouth once for 1 dose.  Dispense: 2 tablet; Refill: 0  Yeast infection Assessment & Plan: Positive wet prep, history consistent with vulvovaginal yeast infection. Given positive pregnancy test today, will treat with vaginal clotrimazole cream at bedtime x 5-7 days.   Orders: -     WET PREP FOR TRICH, YEAST, CLUE -     Clotrimazole; Place 1 Applicatorful vaginally at bedtime for 7 days.  Dispense: 50 g; Refill: 0  Encounter for initial prescription of contraceptive pills Assessment & Plan: Patient with LMP early October and history of irregular periods. Desires POPs. Urine pregnancy test positive today, but patient reports to RN she is undecided on continuing pregnancy. Will send rx for micronor  for future use.   Orders: -     Norethindrone ; Take 1 tablet (0.35 mg total) by mouth daily.  Dispense: 84 tablet; Refill: 3  Positive pregnancy test Assessment & Plan:  LMP early October, history of irregular periods. Pregnancy resources given by RN Mliss Pry.     No follow-ups on file.  No future appointments.  Betsey CHRISTELLA Helling, MD

## 2023-11-29 NOTE — Assessment & Plan Note (Signed)
 Positive wet prep, history consistent with vulvovaginal yeast infection. Given positive pregnancy test today, will treat with vaginal clotrimazole cream at bedtime x 5-7 days.

## 2023-11-29 NOTE — Progress Notes (Signed)
 Pt here for initial family planning visit, birth control and for treatment as contact to NGU.  Wet mount results reviewed with patient.  Results positive for yeast.  Urine pregnancy test today positive.  Positive pregnancy documentation form completed and given to patient.  Positive pregnancy packet provided and reviewed with patient.  Pt is unsure of plans for pregnancy at this time.  Declines prenatal vitamins.  Prescriptions for treatment of yeast and NGU sent to patient's pharmacy.  Additionally provider to send prescription for OCPs if pregnancy is not continued.  Pt verbalizes understanding of above information.-Nilay Mangrum, RN

## 2023-11-29 NOTE — Assessment & Plan Note (Signed)
 LMP early October, history of irregular periods. Pregnancy resources given by RN Mliss Pry.

## 2023-11-29 NOTE — Assessment & Plan Note (Signed)
 Patient with LMP early October and history of irregular periods. Desires POPs. Urine pregnancy test positive today, but patient reports to RN she is undecided on continuing pregnancy. Will send rx for micronor  for future use.

## 2023-11-29 NOTE — Progress Notes (Signed)
 [facility] Department STI clinic [address] [place] [address] Main phone: [phone]  STI screening visit  Subjective:  Rebecca Mccann is a 33 y.o. female being seen today for an STI screening visit. The patient reports they do not have symptoms.    Patient reports they are not pregnant and not breastfeeding. They do not desire a pregnancy in the next year. Patient is currently using no method - no contraceptive precautions to prevent pregnancy (out of her birth control pills for the last six months). They reported they are interested in discussing contraception today.    No LMP recorded (lmp unknown). (Menstrual status: Irregular Periods). LMP early October. Irregular periods.   Patient has the following medical conditions:  Patient Active Problem List   Diagnosis Date Noted   Contact to non-gonococcal urethritis (NGU) 11/29/2023   Vaginal irritation 11/29/2023   Encounter for initial prescription of contraceptive pills 11/29/2023   Positive pregnancy test 11/29/2023   Chief Complaint  Patient presents with   SEXUALLY TRANSMITTED DISEASE   HPI Patient reports desiring treatment because she is a contact to NGU. She reports 2 days now of vaginal pruritus and irritation. No sores, rashes, dysuria, lymphadenopathy. Has not tried any OTC treatments yet.   Desires refill of birth control. Cannot remember name. No contraindications to estrogen: no history of HTN, DVT, PE, stroke, migraine with aura, congenital clotting disorders, or smoking.  Does the patient using douching products? No  See flowsheet for further details and programmatic requirements Hyperlink available at the top of the signed note in blue.  Flow sheet content below:  Pregnancy Intention Screening Does the patient want to become pregnant in the next year?: No Does the patient's partner want to become pregnant in the next year?: No Would the patient like to  discuss contraceptive options today?: Yes All Patients Anyone smoke around pt and/or pt's children?: No Anyone smoke inside pt's house?: No Anyone smoke inside car?: No Anyone smoke inside the workplace?: No Reason For STD Screen STD Screening: Has symptoms Have you ever had an STD?: No History of Antibiotic use in the past 2 weeks?: No STD Symptoms Genital Itching: Yes Lower abdominal pain: No Discharge: No Dysuria: No Genital ulcer / lesion: No Rash: No Vaginal irritation: Yes Oral / Other skin ulcer: No Pain with sex: No Sore Throat: No Visual Changes: No Vaginal Bleeding: No Other (Describe in Comments): No Risk Factors for Hep B Household, sexual, or needle sharing contact of a person infected with Hep B: No Sexual contact with a person who uses drugs not as prescribed?: Yes Currently or Ever used drugs not as prescribed: No HIV Positive: No PRep Patient: No Men who have sex with men: No Have Hepatitis C: No History of Incarceration: No History of Homeslessness?: No Anal sex following anal drug use?: N/A Risk Factors for Hep C Currently using drugs not as prescribed: No Sexual partner(s) currently using drugs as not prescribed: Yes History of drug use: No HIV Positive: No People with a history of incarceration: No People born between the years of [birth year] and 1965: No Hepatitis Counseling Hep B Counseling: Counseled patient about increased risk of Hep B and recommendation for testing, Patient declines testing for Hep B today Hep C Counseling: Counseled patient about increased risk of Hep C and recommendation for testing, Patient declines testing for Hep C today Abuse History Has patient ever been abused physically?: No Has patient ever been abused sexually?: No Does patient  feel they have a problem with Anxiety?: No Does patient feel they have a problem with Depression?: No Referral to Behavioral Health: No Counseling Patient counseled to use condoms with all sex:  Condoms declined RTC in 2-3 weeks for test results: Yes Clinic will call if test results abnormal before test result appt.: Yes Test results given to patient Patient counseled to use condoms with all sex: Condoms declined   Screening for MPX risk:  Unexplained rash?  No   MSM?  No   Multiple or anonymous sex partners?  No   Any close or sexual contact with a person  diagnosed with MPX?  No   Any outside the [place]  where MPX is endemic?  No   High clinical suspicion for MPX?    -Unlikely to be chickenpox    -Lymphadenopathy    -Rash that presents in same phase of       evolution on any given body part  No   Screenings: Last HIV test per patient/review of record was No results found for: HMHIVSCREEN  Lab Results  Component Value Date   HIV Non Reactive 11/24/2023    Last HEPC test per patient/review of record was No results found for: HMHEPCSCREEN No components found for: HEPC   Last HEPB test per patient/review of record was No components found for: HMHEPBSCREEN   Patient reports last pap was:   No results found for: SPECADGYN Result Date Procedure Results Follow-ups  01/07/2020 Cytology - PAP Neisseria Gonorrhea: Negative Chlamydia: Negative Adequacy: Satisfactory for evaluation. Diagnosis: - Negative for intraepithelial lesion or malignancy (NILM) Comment: Normal Reference Ranger Chlamydia - Negative Comment: Normal Reference Range Neisseria Gonorrhea - Negative    Immunization history:  Immunization History  Administered Date(s) Administered   Influenza,inj,Quad PF,6+ Mos 01/22/2020   Tdap 01/17/2022    The following portions of the patient's history were reviewed and updated as appropriate: allergies, current medications, past medical history, past social history, past surgical history and problem list.  Objective:  There were no vitals filed for this visit.  Physical Exam Vitals and nursing note reviewed.  Constitutional:      Appearance: Normal  appearance.  HENT:     Head: Normocephalic.     Mouth/Throat:     Mouth: Mucous membranes are moist.  Cardiovascular:     Rate and Rhythm: Normal rate.  Pulmonary:     Effort: Pulmonary effort is normal.  Abdominal:     Palpations: Abdomen is soft.  Genitourinary:    Comments: Politely declined genital exam. Self swabbed Musculoskeletal:        General: Normal range of motion.  Lymphadenopathy:     Head:     Right side of head: No submandibular, preauricular or posterior auricular adenopathy.     Left side of head: No submandibular, preauricular or posterior auricular adenopathy.     Cervical: No cervical adenopathy.     Upper Body:     Right upper body: No supraclavicular or axillary adenopathy.     Left upper body: No supraclavicular or axillary adenopathy.  Skin:    General: Skin is warm and dry.  Neurological:     Mental Status: She is alert and oriented to person, place, and time.  Psychiatric:        Mood and Affect: Mood normal.    Assessment and Plan:  Rebecca Mccann is a 33 y.o. female presenting to the [facility] Department for STI screening  Contact to non-gonococcal urethritis (NGU) Assessment &  Plan: Given positive pregnancy test today, will treat with azithromycin  1,000 mg once.   Orders: -     Pregnancy, urine  Vaginal irritation Assessment & Plan: Positive wet prep, history consistent with vulvovaginal yeast infection. Given positive pregnancy test today, will treat with vaginal clotrimazole  cream at bedtime x 5-7 days.    Orders: -     WET PREP FOR TRICH, YEAST, CLUE  Encounter for initial prescription of contraceptive pills Assessment & Plan: Patient with LMP early October and history of irregular periods. Desires POPs. Urine pregnancy test positive today, but patient reports to RN she is undecided on continuing pregnancy. Will send rx for micronor  for future use.   Positive pregnancy test Assessment & Plan: LMP early  October, history of irregular periods. Pregnancy resources given by RN Mliss Pry.    Yeast infection  Patient accepted the following screenings: vaginal wet prep Patient meets criteria for HepB screening? Yes. Ordered? no Patient meets criteria for HepC screening? Yes. Ordered? no  Treat wet prep per standing order Discussed time line for [facility] results and that patient will be called with positive results and encouraged patient to call if she had not heard in 2 weeks.  Counseled to return or seek care for continued or worsening symptoms Recommended repeat testing in 3 months with positive results. Recommended condom use with all sex for STI prevention.   No follow-ups on file.  No future appointments.  Betsey CHRISTELLA Helling, MD

## 2023-11-29 NOTE — Assessment & Plan Note (Signed)
 Given positive pregnancy test today, will treat with azithromycin 1,000 mg once.

## 2023-11-30 ENCOUNTER — Ambulatory Visit: Payer: Self-pay | Admitting: Family Medicine

## 2023-11-30 NOTE — Progress Notes (Signed)
 Resulted reviewed in clinic with patient.   Rebecca Helling, MD 11/30/23  9:53 AM

## 2023-12-06 ENCOUNTER — Encounter: Payer: Self-pay | Admitting: Family Medicine

## 2023-12-06 NOTE — Assessment & Plan Note (Signed)
 Positive wet prep, history consistent with vulvovaginal yeast infection. Given positive pregnancy test today, will treat with vaginal clotrimazole cream at bedtime x 5-7 days.

## 2023-12-06 NOTE — Assessment & Plan Note (Signed)
 LMP early October, history of irregular periods. Pregnancy resources given by RN Mliss Pry.

## 2023-12-06 NOTE — Assessment & Plan Note (Signed)
 Patient with LMP early October and history of irregular periods. Desires POPs. Urine pregnancy test positive today, but patient reports to RN she is undecided on continuing pregnancy. Will send rx for micronor  for future use.

## 2023-12-06 NOTE — Assessment & Plan Note (Addendum)
 Given positive pregnancy test today, will treat with azithromycin 1,000 mg once.
